# Patient Record
Sex: Male | Born: 1965
Health system: Southern US, Community
[De-identification: ages and names within clinical notes are randomized; demographics above are authoritative.]

## PROBLEM LIST (undated history)

## (undated) DIAGNOSIS — I2510 Atherosclerotic heart disease of native coronary artery without angina pectoris: Secondary | ICD-10-CM

## (undated) DIAGNOSIS — E119 Type 2 diabetes mellitus without complications: Secondary | ICD-10-CM

## (undated) DIAGNOSIS — E785 Hyperlipidemia, unspecified: Secondary | ICD-10-CM

---

## 2016-07-06 DIAGNOSIS — K863 Pseudocyst of pancreas: Secondary | ICD-10-CM | POA: Diagnosis not present

## 2016-07-06 DIAGNOSIS — K859 Acute pancreatitis without necrosis or infection, unspecified: Secondary | ICD-10-CM | POA: Diagnosis not present

## 2016-07-06 DIAGNOSIS — K861 Other chronic pancreatitis: Secondary | ICD-10-CM | POA: Diagnosis not present

## 2016-07-06 DIAGNOSIS — R7989 Other specified abnormal findings of blood chemistry: Secondary | ICD-10-CM | POA: Diagnosis not present

## 2016-07-07 DIAGNOSIS — K863 Pseudocyst of pancreas: Secondary | ICD-10-CM | POA: Diagnosis not present

## 2016-07-07 DIAGNOSIS — Z9689 Presence of other specified functional implants: Secondary | ICD-10-CM | POA: Diagnosis not present

## 2016-07-07 DIAGNOSIS — K861 Other chronic pancreatitis: Secondary | ICD-10-CM | POA: Diagnosis not present

## 2016-07-07 DIAGNOSIS — K862 Cyst of pancreas: Secondary | ICD-10-CM | POA: Diagnosis not present

## 2016-07-07 DIAGNOSIS — R1084 Generalized abdominal pain: Secondary | ICD-10-CM | POA: Diagnosis not present

## 2016-07-07 DIAGNOSIS — R109 Unspecified abdominal pain: Secondary | ICD-10-CM | POA: Diagnosis not present

## 2016-07-07 DIAGNOSIS — F17200 Nicotine dependence, unspecified, uncomplicated: Secondary | ICD-10-CM | POA: Diagnosis not present

## 2016-07-08 DIAGNOSIS — Z9689 Presence of other specified functional implants: Secondary | ICD-10-CM | POA: Diagnosis not present

## 2016-07-08 DIAGNOSIS — R109 Unspecified abdominal pain: Secondary | ICD-10-CM | POA: Diagnosis not present

## 2016-07-08 DIAGNOSIS — K861 Other chronic pancreatitis: Secondary | ICD-10-CM | POA: Diagnosis not present

## 2016-07-16 DIAGNOSIS — Z0000 Encounter for general adult medical examination without abnormal findings: Secondary | ICD-10-CM | POA: Diagnosis not present

## 2016-07-16 DIAGNOSIS — E1165 Type 2 diabetes mellitus with hyperglycemia: Secondary | ICD-10-CM | POA: Diagnosis not present

## 2016-07-16 DIAGNOSIS — Z6827 Body mass index (BMI) 27.0-27.9, adult: Secondary | ICD-10-CM | POA: Diagnosis not present

## 2016-08-08 DIAGNOSIS — K859 Acute pancreatitis without necrosis or infection, unspecified: Secondary | ICD-10-CM | POA: Diagnosis not present

## 2016-08-08 DIAGNOSIS — K863 Pseudocyst of pancreas: Secondary | ICD-10-CM | POA: Diagnosis not present

## 2016-08-08 DIAGNOSIS — K219 Gastro-esophageal reflux disease without esophagitis: Secondary | ICD-10-CM | POA: Diagnosis not present

## 2016-08-08 DIAGNOSIS — K860 Alcohol-induced chronic pancreatitis: Secondary | ICD-10-CM | POA: Diagnosis not present

## 2016-08-08 DIAGNOSIS — Z79899 Other long term (current) drug therapy: Secondary | ICD-10-CM | POA: Diagnosis not present

## 2016-08-08 DIAGNOSIS — G8929 Other chronic pain: Secondary | ICD-10-CM | POA: Diagnosis not present

## 2016-08-08 DIAGNOSIS — F17210 Nicotine dependence, cigarettes, uncomplicated: Secondary | ICD-10-CM | POA: Diagnosis not present

## 2016-08-08 DIAGNOSIS — T182XXA Foreign body in stomach, initial encounter: Secondary | ICD-10-CM | POA: Diagnosis not present

## 2016-08-08 DIAGNOSIS — Z79891 Long term (current) use of opiate analgesic: Secondary | ICD-10-CM | POA: Diagnosis not present

## 2016-08-08 DIAGNOSIS — K862 Cyst of pancreas: Secondary | ICD-10-CM | POA: Diagnosis not present

## 2016-10-03 DIAGNOSIS — E119 Type 2 diabetes mellitus without complications: Secondary | ICD-10-CM | POA: Diagnosis not present

## 2016-12-17 DIAGNOSIS — Z6829 Body mass index (BMI) 29.0-29.9, adult: Secondary | ICD-10-CM | POA: Diagnosis not present

## 2016-12-17 DIAGNOSIS — N529 Male erectile dysfunction, unspecified: Secondary | ICD-10-CM | POA: Diagnosis not present

## 2016-12-17 DIAGNOSIS — Z1389 Encounter for screening for other disorder: Secondary | ICD-10-CM | POA: Diagnosis not present

## 2016-12-17 DIAGNOSIS — E119 Type 2 diabetes mellitus without complications: Secondary | ICD-10-CM | POA: Diagnosis not present

## 2017-06-10 ENCOUNTER — Encounter (HOSPITAL_COMMUNITY): Payer: Self-pay | Admitting: Emergency Medicine

## 2017-06-10 ENCOUNTER — Inpatient Hospital Stay (HOSPITAL_COMMUNITY)
Admission: EM | Admit: 2017-06-10 | Discharge: 2017-06-13 | DRG: 246 | Disposition: A | Discharge status: Home / Self Care | Payer: BLUE CROSS/BLUE SHIELD | Attending: Cardiology | Admitting: Cardiology

## 2017-06-10 DIAGNOSIS — R079 Chest pain, unspecified: Secondary | ICD-10-CM | POA: Diagnosis not present

## 2017-06-10 DIAGNOSIS — E119 Type 2 diabetes mellitus without complications: Secondary | ICD-10-CM | POA: Diagnosis present

## 2017-06-10 DIAGNOSIS — R918 Other nonspecific abnormal finding of lung field: Secondary | ICD-10-CM | POA: Diagnosis not present

## 2017-06-10 DIAGNOSIS — I214 Non-ST elevation (NSTEMI) myocardial infarction: Secondary | ICD-10-CM | POA: Diagnosis not present

## 2017-06-10 DIAGNOSIS — E785 Hyperlipidemia, unspecified: Secondary | ICD-10-CM | POA: Diagnosis not present

## 2017-06-10 DIAGNOSIS — Z955 Presence of coronary angioplasty implant and graft: Secondary | ICD-10-CM

## 2017-06-10 DIAGNOSIS — I5021 Acute systolic (congestive) heart failure: Secondary | ICD-10-CM | POA: Diagnosis present

## 2017-06-10 DIAGNOSIS — I2510 Atherosclerotic heart disease of native coronary artery without angina pectoris: Secondary | ICD-10-CM | POA: Diagnosis not present

## 2017-06-10 DIAGNOSIS — E118 Type 2 diabetes mellitus with unspecified complications: Secondary | ICD-10-CM

## 2017-06-10 DIAGNOSIS — I10 Essential (primary) hypertension: Secondary | ICD-10-CM

## 2017-06-10 DIAGNOSIS — I110 Hypertensive heart disease with heart failure: Secondary | ICD-10-CM | POA: Diagnosis present

## 2017-06-10 DIAGNOSIS — Z79899 Other long term (current) drug therapy: Secondary | ICD-10-CM

## 2017-06-10 DIAGNOSIS — R0789 Other chest pain: Secondary | ICD-10-CM | POA: Diagnosis not present

## 2017-06-10 DIAGNOSIS — I255 Ischemic cardiomyopathy: Secondary | ICD-10-CM

## 2017-06-10 HISTORY — DX: Hyperlipidemia, unspecified: E78.5

## 2017-06-10 HISTORY — DX: Type 2 diabetes mellitus without complications: E11.9

## 2017-06-10 HISTORY — DX: Atherosclerotic heart disease of native coronary artery without angina pectoris: I25.10

## 2017-06-10 LAB — BASIC METABOLIC PANEL
Anion gap: 9 (ref 5–15)
BUN: 23 mg/dL — ABNORMAL HIGH (ref 6–20)
CALCIUM: 9.3 mg/dL (ref 8.9–10.3)
CO2: 25 mmol/L (ref 22–32)
Chloride: 100 mmol/L — ABNORMAL LOW (ref 101–111)
Creatinine, Ser: 1.04 mg/dL (ref 0.61–1.24)
GLUCOSE: 183 mg/dL — AB (ref 65–99)
POTASSIUM: 3.9 mmol/L (ref 3.5–5.1)
SODIUM: 134 mmol/L — AB (ref 135–145)

## 2017-06-10 LAB — CBC
HCT: 42.6 % (ref 39.0–52.0)
HEMOGLOBIN: 14.5 g/dL (ref 13.0–17.0)
MCH: 29.2 pg (ref 26.0–34.0)
MCHC: 34 g/dL (ref 30.0–36.0)
MCV: 85.7 fL (ref 78.0–100.0)
Platelets: 203 10*3/uL (ref 150–400)
RBC: 4.97 MIL/uL (ref 4.22–5.81)
RDW: 12.6 % (ref 11.5–15.5)
WBC: 5.7 10*3/uL (ref 4.0–10.5)

## 2017-06-10 LAB — I-STAT TROPONIN, ED: TROPONIN I, POC: 2.44 ng/mL — AB (ref 0.00–0.08)

## 2017-06-10 LAB — D-DIMER, QUANTITATIVE: D-Dimer, Quant: <0.27 ug/mL-FEU (ref 0.00–0.50)

## 2017-06-10 MED ORDER — HEPARIN (PORCINE) IN NACL 100-0.45 UNIT/ML-% IJ SOLN
1200.0000 [IU]/h | INTRAMUSCULAR | Status: DC
  Administered 2017-06-11: 1000 [IU]/h via INTRAVENOUS
  Filled 2017-06-10: qty 250

## 2017-06-10 MED ORDER — HEPARIN BOLUS VIA INFUSION
4000.0000 [IU] | Freq: Once | INTRAVENOUS | Status: AC
  Administered 2017-06-11: 4000 [IU] via INTRAVENOUS
  Filled 2017-06-10: qty 4000

## 2017-06-10 MED ORDER — ASPIRIN 81 MG PO CHEW
243.0000 mg | CHEWABLE_TABLET | Freq: Once | ORAL | Status: AC
  Administered 2017-06-10: 243 mg via ORAL
  Filled 2017-06-10: qty 3

## 2017-06-10 NOTE — ED Notes (Signed)
Pt sts he has not had any Chest pain today. Pt states the pain gets worse when he eats so he thinks its GERD. States he started taking OTC medication for GERD and it seems to help.

## 2017-06-10 NOTE — Progress Notes (Signed)
ANTICOAGULATION CONSULT NOTE - Initial Consult  Pharmacy Consult for heparin  Indication: chest pain/ACS  Patient Measurements: Height: 5\' 6"  (167.6 cm) Weight: 179 lb (81.2 kg) IBW/kg (Calculated) : 63.8 Heparin Dosing Weight: 80 kg   Vital Signs: Temp: 98.6 F (37 C) (07/03 2143) Temp Source: Oral (07/03 2143) BP: 124/90 (07/03 2300) Pulse Rate: 77 (07/03 2300)  Labs:  Recent Labs  06/10/17 2145  HGB 14.5  HCT 42.6  PLT 203  CREATININE 1.04    CrCl cannot be calculated (Unknown ideal weight.).  Assessment: 51 yo male admitted with chest pain. Pharmacy to dose heparin. No oral anticoagulation PTA. CBC stable with no overt s/s bleeding noted.   Goal of Therapy:  Heparin level 0.3-0.7 units/ml Monitor platelets by anticoagulation protocol: Yes   Plan:  Heparin 4000 units IV x1, then start infusion at 1000 units/hr  Heparin level in 6 hours  Heparin level and CBC daily  Monitor for s/s bleeding  York CeriseKatherine Cook, PharmD Pharmacy Resident  Pager [phone] 06/10/17 11:21 PM

## 2017-06-10 NOTE — ED Notes (Signed)
ED Provider at bedside. 

## 2017-06-10 NOTE — ED Provider Notes (Signed)
[facility]-EMERGENCY DEPT Provider Note   CSN: [id number] Arrival date & time: 06/10/17  2127     History   Chief Complaint Chief Complaint  Patient presents with  . Chest Pain    HPI Brett Collier is a 51 y.o. male.  51 year old male with past medical history of type 2 diabetes mellitus who presents with chest pain. The patient reports 2 weeks of intermittent chest pain that lasts a few hours at a time, occurs at rest, and typically occurs in the evening after he lays down. He describes the pain as a central, nonradiating chest pressure with associated nausea and diaphoresis. No significant shortness of breath during the episodes but he does report some shortness of breath 3 days ago while he was walking around. He denies any chest pain or shortness of breath today. No leg swelling or pain. No fevers, vomiting, diarrhea, or recent illness. He has taken Zantac which has seemed to help his symptoms recently. He has not had pain today but decided to get checked out and went to an urgent care where he was sent here due to concerning EKG. He received 81 mg aspirin prior to arrival. His last episode of chest pain was last night from 9 to 11 PM. He did have a long car ride to [place] and back a few days ago. He denies any bleeding problems or black stools.   He states mom and grandparents had heart problems.   The history is provided by the patient.  Chest Pain      History reviewed. No pertinent past medical history.  There are no active problems to display for this patient.   History reviewed. No pertinent surgical history.     Home Medications    Prior to Admission medications   Not on File    Family History No family history on file.  Social History Social History  Substance Use Topics  . Smoking status: Never Smoker  . Smokeless tobacco: Never Used  . Alcohol use No     Allergies   Patient has no allergy information on record.   Review of Systems Review of  Systems  Cardiovascular: Positive for chest pain.   All other systems reviewed and are negative except that which was mentioned in HPI   Physical Exam Updated Vital Signs BP (!) 135/99   Pulse 78   Temp 98.6 F (37 C) (Oral)   Resp 14   SpO2 99%   Physical Exam  Constitutional: He is oriented to person, place, and time. He appears well-developed and well-nourished. No distress.  HENT:  Head: Normocephalic and atraumatic.  Moist mucous membranes  Eyes: Conjunctivae are normal. Pupils are equal, round, and reactive to light.  Neck: Neck supple.  Cardiovascular: Normal rate, regular rhythm and normal heart sounds.   No murmur heard. Pulmonary/Chest: Effort normal and breath sounds normal.  Abdominal: Soft. Bowel sounds are normal. He exhibits no distension. There is no tenderness.  Musculoskeletal: He exhibits no edema or tenderness.  Neurological: He is alert and oriented to person, place, and time.  Fluent speech  Skin: Skin is warm and dry.  Psychiatric: He has a normal mood and affect. Judgment normal.  Nursing note and vitals reviewed.    ED Treatments / Results  Labs (all labs ordered are listed, but only abnormal results are displayed) Labs Reviewed  BASIC METABOLIC PANEL - Abnormal; Notable for the following:       Result Value   Sodium 134 (*)  Chloride 100 (*)    Glucose, Bld 183 (*)    BUN 23 (*)    All other components within normal limits  I-STAT TROPOININ, ED - Abnormal; Notable for the following:    Troponin i, poc 2.44 (*)    All other components within normal limits  CBC  D-DIMER, QUANTITATIVE (NOT AT [facility])    EKG  EKG Interpretation  Date/Time:  Tuesday June 10 2017 21:43:52 EDT Ventricular Rate:  84 PR Interval:  138 QRS Duration: 84 QT Interval:  344 QTC Calculation: 406 R Axis:   76 Text Interpretation:  Normal sinus rhythm with sinus arrhythmia Septal infarct , age undetermined Abnormal ECG T wave flattening inferiorly, no previous  tracing available Confirmed by Frederick PeersLittle, Darionna Banke [phone]([id number]) on 06/10/2017 10:47:37 PM       Radiology No results found.  Procedures .Critical Care Performed by: Laurence SpatesLITTLE, Hershy Flenner MORGAN Authorized by: Laurence SpatesLITTLE, Shaivi Rothschild MORGAN   Critical care provider statement:    Critical care time (minutes):  30   Critical care time was exclusive of:  Separately billable procedures and treating other patients   Critical care was necessary to treat or prevent imminent or life-threatening deterioration of the following conditions:  Cardiac failure   Critical care was time spent personally by me on the following activities:  Development of treatment plan with patient or surrogate, discussions with consultants, examination of patient, obtaining history from patient or surrogate, ordering and performing treatments and interventions, ordering and review of laboratory studies, ordering and review of radiographic studies and re-evaluation of patient's condition   (including critical care time)  Medications Ordered in ED Medications  heparin ADULT infusion 100 units/mL (25000 units/24150mL sodium chloride 0.45%) (1,000 Units/hr Intravenous New Bag/Given 06/11/17 0036)  aspirin chewable tablet 243 mg (243 mg Oral Given 06/10/17 2318)  heparin bolus via infusion 4,000 Units (4,000 Units Intravenous Bolus from Bag 06/11/17 0039)     Initial Impression / Assessment and Plan / ED Course  I have reviewed the triage vital signs and the nursing notes.  Pertinent labs & imaging results that were available during my care of the patient were reviewed by me and considered in my medical decision making (see chart for details).     Pt w/ 2 weeks of intermittent chest pressure, Typically occur in the evenings and seemed to improve his Zantac sometimes. Sent here after concerning EKG at urgent care today. He was comfortable on exam and denied any symptoms of chest pain or shortness of breath today. His EKG shows no significant ST elevation  but did have some T-wave flattening inferiorly. Gave 3 more aspirin and obtained labs. Trop elevated at 2.4. Initiated a heparin drip. At a d-dimer because of his recent long travel. D-dimer negative. Discussed w/ cardiology, Dr. Virgina OrganQureshi, who will admit pt for further care. Pt chest pain free during ED course.  Final Clinical Impressions(s) / ED Diagnoses   Final diagnoses:  NSTEMI (non-ST elevated myocardial infarction) [facility](HCC)    New Prescriptions New Prescriptions   No medications on file     Oscar Hank, Ambrose Finlandachel Morgan, MD 06/11/17 [phone]

## 2017-06-10 NOTE — ED Notes (Signed)
Patient transported to X-ray 

## 2017-06-10 NOTE — ED Triage Notes (Signed)
Pt reports centralized chest pain for over two weeks. Pt believed it is indigestion however when he went to urgent care they sent him due to a "concerning EKG."  Denies weakness, sob, n/v/d.

## 2017-06-11 ENCOUNTER — Encounter (HOSPITAL_COMMUNITY): Payer: Self-pay | Admitting: Internal Medicine

## 2017-06-11 ENCOUNTER — Inpatient Hospital Stay (HOSPITAL_COMMUNITY): Payer: BLUE CROSS/BLUE SHIELD

## 2017-06-11 DIAGNOSIS — I110 Hypertensive heart disease with heart failure: Secondary | ICD-10-CM | POA: Diagnosis present

## 2017-06-11 DIAGNOSIS — E119 Type 2 diabetes mellitus without complications: Secondary | ICD-10-CM

## 2017-06-11 DIAGNOSIS — E118 Type 2 diabetes mellitus with unspecified complications: Secondary | ICD-10-CM

## 2017-06-11 DIAGNOSIS — I214 Non-ST elevation (NSTEMI) myocardial infarction: Secondary | ICD-10-CM | POA: Diagnosis not present

## 2017-06-11 DIAGNOSIS — Z79899 Other long term (current) drug therapy: Secondary | ICD-10-CM | POA: Diagnosis not present

## 2017-06-11 DIAGNOSIS — I5021 Acute systolic (congestive) heart failure: Secondary | ICD-10-CM | POA: Diagnosis not present

## 2017-06-11 DIAGNOSIS — I2510 Atherosclerotic heart disease of native coronary artery without angina pectoris: Secondary | ICD-10-CM | POA: Diagnosis not present

## 2017-06-11 DIAGNOSIS — R079 Chest pain, unspecified: Secondary | ICD-10-CM | POA: Diagnosis not present

## 2017-06-11 DIAGNOSIS — E785 Hyperlipidemia, unspecified: Secondary | ICD-10-CM | POA: Diagnosis not present

## 2017-06-11 HISTORY — PX: TRANSTHORACIC ECHOCARDIOGRAM: SHX275

## 2017-06-11 LAB — CBC
HEMATOCRIT: 42.2 % (ref 39.0–52.0)
Hemoglobin: 14.4 g/dL (ref 13.0–17.0)
MCH: 29.1 pg (ref 26.0–34.0)
MCHC: 34.1 g/dL (ref 30.0–36.0)
MCV: 85.4 fL (ref 78.0–100.0)
PLATELETS: 187 10*3/uL (ref 150–400)
RBC: 4.94 MIL/uL (ref 4.22–5.81)
RDW: 12.5 % (ref 11.5–15.5)
WBC: 5.3 10*3/uL (ref 4.0–10.5)

## 2017-06-11 LAB — GLUCOSE, CAPILLARY
GLUCOSE-CAPILLARY: 169 mg/dL — AB (ref 65–99)
Glucose-Capillary: 215 mg/dL — ABNORMAL HIGH (ref 65–99)
Glucose-Capillary: 227 mg/dL — ABNORMAL HIGH (ref 65–99)
Glucose-Capillary: 154 mg/dL — ABNORMAL HIGH (ref 65–99)
Glucose-Capillary: 213 mg/dL — ABNORMAL HIGH (ref 65–99)

## 2017-06-11 LAB — COMPREHENSIVE METABOLIC PANEL
ALT: 23 U/L (ref 17–63)
AST: 27 U/L (ref 15–41)
Albumin: 3.7 g/dL (ref 3.5–5.0)
Alkaline Phosphatase: 43 U/L (ref 38–126)
Anion gap: 8 (ref 5–15)
BUN: 19 mg/dL (ref 6–20)
CHLORIDE: 102 mmol/L (ref 101–111)
CO2: 28 mmol/L (ref 22–32)
CREATININE: 1.08 mg/dL (ref 0.61–1.24)
Calcium: 9.2 mg/dL (ref 8.9–10.3)
Glucose, Bld: 204 mg/dL — ABNORMAL HIGH (ref 65–99)
POTASSIUM: 3.6 mmol/L (ref 3.5–5.1)
Sodium: 138 mmol/L (ref 135–145)
Total Bilirubin: 1 mg/dL (ref 0.3–1.2)
Total Protein: 6.7 g/dL (ref 6.5–8.1)

## 2017-06-11 LAB — TSH: TSH: 0.949 u[IU]/mL (ref 0.350–4.500)

## 2017-06-11 LAB — LIPID PANEL
CHOLESTEROL: 164 mg/dL (ref 0–200)
HDL: 29 mg/dL — ABNORMAL LOW (ref >40)
LDL Cholesterol: 109 mg/dL — ABNORMAL HIGH (ref 0–99)
Total CHOL/HDL Ratio: 5.7 RATIO
Triglycerides: 129 mg/dL (ref <150)
VLDL: 26 mg/dL (ref 0–40)

## 2017-06-11 LAB — TROPONIN I
TROPONIN I: 2.57 ng/mL — AB (ref <0.03)
TROPONIN I: 2.32 ng/mL — AB (ref <0.03)
TROPONIN I: 2.14 ng/mL — AB (ref <0.03)

## 2017-06-11 LAB — ECHOCARDIOGRAM COMPLETE
HEIGHTINCHES: 66 in
Weight: 2843.2 oz

## 2017-06-11 LAB — HEPARIN LEVEL (UNFRACTIONATED)
HEPARIN UNFRACTIONATED: 0.24 [IU]/mL — AB (ref 0.30–0.70)
Heparin Unfractionated: 0.29 IU/mL — ABNORMAL LOW (ref 0.30–0.70)

## 2017-06-11 LAB — BRAIN NATRIURETIC PEPTIDE: B NATRIURETIC PEPTIDE 5: 212.5 pg/mL — AB (ref 0.0–100.0)

## 2017-06-11 LAB — HIV ANTIBODY (ROUTINE TESTING W REFLEX): HIV Screen 4th Generation wRfx: NONREACTIVE

## 2017-06-11 LAB — PROTIME-INR
INR: 1.07
PROTHROMBIN TIME: 14 s (ref 11.4–15.2)

## 2017-06-11 MED ORDER — HEPARIN (PORCINE) IN NACL 100-0.45 UNIT/ML-% IJ SOLN
1350.0000 [IU]/h | INTRAMUSCULAR | Status: DC
  Administered 2017-06-11: 1350 [IU]/h via INTRAVENOUS
  Filled 2017-06-11: qty 250

## 2017-06-11 MED ORDER — INSULIN ASPART 100 UNIT/ML ~~LOC~~ SOLN
0.0000 [IU] | Freq: Every day | SUBCUTANEOUS | Status: DC
  Administered 2017-06-11: 2 [IU] via SUBCUTANEOUS
  Administered 2017-06-12: 4 [IU] via SUBCUTANEOUS

## 2017-06-11 MED ORDER — SODIUM CHLORIDE 0.9% FLUSH: 3.0000 mL | INTRAVENOUS | Status: DC | PRN

## 2017-06-11 MED ORDER — SODIUM CHLORIDE 0.9 % IV SOLN: 250.0000 mL | INTRAVENOUS | Status: DC | PRN

## 2017-06-11 MED ORDER — SODIUM CHLORIDE 0.9% FLUSH
3.0000 mL | Freq: Two times a day (BID) | INTRAVENOUS | Status: DC
  Administered 2017-06-11: 3 mL via INTRAVENOUS

## 2017-06-11 MED ORDER — SODIUM CHLORIDE 0.9 % WEIGHT BASED INFUSION
3.0000 mL/kg/h | INTRAVENOUS | Status: DC
  Administered 2017-06-12: 3 mL/kg/h via INTRAVENOUS

## 2017-06-11 MED ORDER — ASPIRIN EC 81 MG PO TBEC
81.0000 mg | DELAYED_RELEASE_TABLET | Freq: Every day | ORAL | Status: DC
Start: 2017-06-11 — End: 2017-06-13
  Administered 2017-06-11: 81 mg via ORAL
  Filled 2017-06-11: qty 1

## 2017-06-11 MED ORDER — INSULIN ASPART 100 UNIT/ML ~~LOC~~ SOLN
0.0000 [IU] | Freq: Three times a day (TID) | SUBCUTANEOUS | Status: DC
  Administered 2017-06-11: 3 [IU] via SUBCUTANEOUS
  Administered 2017-06-11: 2 [IU] via SUBCUTANEOUS
  Administered 2017-06-11: 3 [IU] via SUBCUTANEOUS
  Administered 2017-06-12: 12:00:00 2 [IU] via SUBCUTANEOUS
  Administered 2017-06-12: 3 [IU] via SUBCUTANEOUS
  Administered 2017-06-12: 17:00:00 2 [IU] via SUBCUTANEOUS
  Administered 2017-06-13: 07:00:00 3 [IU] via SUBCUTANEOUS

## 2017-06-11 MED ORDER — ATORVASTATIN CALCIUM 80 MG PO TABS
80.0000 mg | ORAL_TABLET | Freq: Every day | ORAL | Status: DC
  Administered 2017-06-11 – 2017-06-12 (×2): 80 mg via ORAL
  Filled 2017-06-11 (×2): qty 1

## 2017-06-11 MED ORDER — ASPIRIN 81 MG PO CHEW
81.0000 mg | CHEWABLE_TABLET | ORAL | Status: AC
  Administered 2017-06-12: 81 mg via ORAL
  Filled 2017-06-11: qty 1

## 2017-06-11 MED ORDER — SODIUM CHLORIDE 0.9 % WEIGHT BASED INFUSION: 1.0000 mL/kg/h | INTRAVENOUS | Status: DC

## 2017-06-11 MED ORDER — SODIUM CHLORIDE 0.9% FLUSH: 3.0000 mL | Freq: Two times a day (BID) | INTRAVENOUS | Status: DC

## 2017-06-11 NOTE — Progress Notes (Signed)
Subjective:  Admitted with some atypical chest discomfort last night but has abnormal troponins and an EKG suggestive of an old anteroseptal infarction.  Currently pain-free on heparin.  Not happy about having to stay in the hospital.  Objective:  Vital Signs in the last 24 hours: BP (!) 157/89 (BP Location: Right Arm)   Pulse 79   Temp 98.3 F (36.8 C) (Oral)   Resp 18   Ht 5' 6" (1.676 m)   Wt 80.6 kg (177 lb 11.2 oz)   SpO2 99%   BMI 28.68 kg/m   Physical Exam: Middle-aged black male no acute distress Lungs:  Clear Cardiac:  Regular rhythm, normal S1 and S2, no S3 Extremities:  No edema present  Intake/Output from previous day: 07/03 0701 - 07/04 0700 In: 294 [P.O.:240; I.V.:54] Out: 275 [Urine:275]  Weight Filed Weights   06/10/17 2328 06/11/17 0308  Weight: 81.2 kg (179 lb) 80.6 kg (177 lb 11.2 oz)    Lab Results: Basic Metabolic Panel:  Recent Labs  06/10/17 2145 06/11/17 0428  NA 134* 138  K 3.9 3.6  CL 100* 102  CO2 25 28  GLUCOSE 183* 204*  BUN 23* 19  CREATININE 1.04 1.08   CBC:  Recent Labs  06/10/17 2145 06/11/17 0428  WBC 5.7 5.3  HGB 14.5 14.4  HCT 42.6 42.2  MCV 85.7 85.4  PLT 203 187   Cardiac Enzymes: Troponin (Point of Care Test)  Recent Labs  06/10/17 2202  TROPIPOC 2.44*   Cardiac Panel (last 3 results)  Recent Labs  06/11/17 0428 06/11/17 0801  TROPONINI 2.57* 2.32*    Telemetry: Personally reviewed.  Normal sinus rhythm  Assessment/Plan:  1.  Non-STEMI with elevated troponins 2.  Diabetes mellitus 3.  Hypertension  Recommendations:  Abnormal troponins and diabetes with chest pain will need cardiac catheterization.Cardiac catheterization was discussed with the patient fully including risks of myocardial infarction, death, stroke, bleeding, arrhythmia, dye allergy, renal insufficiency or bleeding.  The patient understands and is willing to proceed.  Possibility of intervention at the same time also discussed  with patient and he understands and is willing to proceed.  Discussed catheterization through the radial approach.  To be done by [facility] heart [facility].     W. Spencer Tilley, Jr.  MD FACC Cardiology  06/11/2017, 9:54 AM   

## 2017-06-11 NOTE — Progress Notes (Signed)
ANTICOAGULATION CONSULT NOTE - FOLLOW UP    HL = 0.29 (goal 0.3 - 0.7 units/mL) Heparin dosing weight = 80 kg   Assessment: [name] continues on IV heparin for ACS.  Heparin level is slightly below goal post rate increase this AM.  No bleeding nor complication with heparin infusion per RN.   Plan: - Increase heparin gtt to 1350 units/hr - F/U AM labs    Brett Collier, PharmD, BCPS 06/11/2017, 4:57 PM

## 2017-06-11 NOTE — Progress Notes (Signed)
ANTICOAGULATION CONSULT NOTE - Initial Consult  Pharmacy Consult for heparin  Indication: chest pain/ACS  Patient Measurements: Height: 5\' 6"  (167.6 cm) Weight: 177 lb 11.2 oz (80.6 kg) IBW/kg (Calculated) : 63.8 Heparin Dosing Weight: 80 kg   Vital Signs: Temp: 98.3 F (36.8 C) (07/04 0308) Temp Source: Oral (07/04 0308) BP: 157/89 (07/04 0308) Pulse Rate: 79 (07/04 0308)  Labs:  Recent Labs  06/10/17 2145 06/11/17 0428 06/11/17 0801 06/11/17 0811  HGB 14.5 14.4  --   --   HCT 42.6 42.2  --   --   PLT 203 187  --   --   LABPROT  --  14.0  --   --   INR  --  1.07  --   --   HEPARINUNFRC  --   --   --  0.24*  CREATININE 1.04 1.08  --   --   TROPONINI  --  2.57* 2.32*  --     Estimated Creatinine Clearance: 79.8 mL/min (by C-G formula based on SCr of 1.08 mg/dL).  Assessment: [name] admitted with chest pain and found to have elevated trops and EKG changes. Pharmacy to dose heparin, no oral anticoagulation PTA, CBC wnl. Initial heparin level subtherapeutic at 0.24.  Goal of Therapy:  Heparin level 0.3-0.7 units/ml Monitor platelets by anticoagulation protocol: Yes   Plan:  -Increase heparin to 1200 units/hr -Heparin level in 6 hours  -Monitor heparin level, CBC, S/Sx bleeding daily -F/U plans for cath  Fredonia HighlandMichael Bitonti, PharmD PGY-2 Cardiology Pharmacy Resident Pager: [phone] 06/11/2017

## 2017-06-11 NOTE — Progress Notes (Signed)
  Echocardiogram 2D Echocardiogram has been performed.  [name] [name] 06/11/2017, 3:45 PM

## 2017-06-11 NOTE — H&P (Signed)
CARDIOLOGY INPATIENT HISTORY AND PHYSICAL EXAMINATION NOTE  Patient ID: Brett Collier MRN: [id number], DOB/AGE: 52-Nov-1967   Admit date: 06/10/2017   Primary Physician: No primary care provider on file. Primary Cardiologist: new  Reason for admission: chest pain  HPI: This is a 51 y.o.AA male with no known history of CAD but with NIDDM2 presented with intermittent chest pain for last week. He went to urgent care today since he has been having indigestion like symptoms for the last week for which he has been taking zantac which was partially helping his symptoms. At urgent care he was found to have abnormal ECG with non specific ST depression in the inferior leads and Q waves in the anteroseptal leads. Patient has been having chest pains for the last 2 weeks. Eating and laying down makes the chest pain come. Last chest pain was around 9 PM last night. He was sent to [facility] ED where he underwent d-dimer test, trop and ECG. His istat trop was 2.44. He is being admitted for NSTEMI.   Problem List: Past Medical History:  Diagnosis Date  . Diabetes (HCC)     History reviewed. No pertinent surgical history.   Allergies: No Known Allergies   Home Medications Current Facility-Administered Medications  Medication Dose Route Frequency Provider Last Rate Last Dose  . heparin ADULT infusion 100 units/mL (25000 units/270mL sodium chloride 0.45%)  1,000 Units/hr Intravenous Continuous [name], RPH      . heparin bolus via infusion 4,000 Units  4,000 Units Intravenous Once [name], [facility]       Current Outpatient Prescriptions  Medication Sig Dispense Refill  . metFORMIN (GLUCOPHAGE-XR) 500 MG 24 hr tablet Take 1,000 mg by mouth 2 (two) times daily.    . pioglitazone (ACTOS) 30 MG tablet Take 30 mg by mouth daily.       Family History  Problem Relation Age of Onset  . Heart Problems Mother   . Heart Problems Maternal Grandfather      Social History   Social History  .  Marital status: Married    Spouse name: N/A  . Number of children: N/A  . Years of education: N/A   Occupational History  . Not on file.   Social History Main Topics  . Smoking status: Never Smoker  . Smokeless tobacco: Never Used  . Alcohol use No  . Drug use: No  . Sexual activity: Not on file   Other Topics Concern  . Not on file   Social History Narrative  . No narrative on file     Review of Systems: General: negative for chills, fever, night sweats or weight changes.  Cardiovascular: chest pain,, no chf symptoms Dermatological: negative for rash Respiratory: negative for cough or wheezing Urologic: negative for hematuria Abdominal: indigestion,no nausea Neurologic: negative for visual changes, syncope, or dizziness Endocrine: no diabetes, no hypothyroidism Immunological: no lymph adenopathy Psych: non homicidal/suicidal  Physical Exam: Vitals: BP (!) 125/92   Pulse 78   Temp 98.6 F (37 C) (Oral)   Resp 18   Ht 5\' 6"  (1.676 m)   Wt 81.2 kg (179 lb)   SpO2 99%   BMI 28.89 kg/m  General: not in acute distress Neck: JVP flat, neck supple Heart: regular rate and rhythm, S1, S2, no murmurs  Lungs: CTAB  GI: non tender, non distended, bowel sounds present Extremities: no edema Neuro: AAO x 3  Psych: normal affect, no anxiety   Labs:   Results for orders placed or  performed during the hospital encounter of 06/10/17 (from the past 24 hour(s))  Basic metabolic panel     Status: Abnormal   Collection Time: 06/10/17  9:45 PM  Result Value Ref Range   Sodium 134 (L) 135 - 145 mmol/L   Potassium 3.9 3.5 - 5.1 mmol/L   Chloride 100 (L) 101 - 111 mmol/L   CO2 25 22 - 32 mmol/L   Glucose, Bld 183 (H) 65 - 99 mg/dL   BUN 23 (H) 6 - 20 mg/dL   Creatinine, Ser 0.861.04 0.61 - 1.24 mg/dL   Calcium 9.3 8.9 - 57.810.3 mg/dL   GFR calc non Af Amer >60 >60 mL/min   GFR calc Af Amer >60 >60 mL/min   Anion gap 9 5 - 15  CBC     Status: None   Collection Time: 06/10/17   9:45 PM  Result Value Ref Range   WBC 5.7 4.0 - 10.5 K/uL   RBC 4.97 4.22 - 5.81 MIL/uL   Hemoglobin 14.5 13.0 - 17.0 g/dL   HCT 46.942.6 62.939.0 - 52.852.0 %   MCV 85.7 78.0 - 100.0 fL   MCH 29.2 26.0 - 34.0 pg   MCHC 34.0 30.0 - 36.0 g/dL   RDW 41.312.6 24.411.5 - 01.015.5 %   Platelets 203 150 - 400 K/uL  I-stat troponin, ED     Status: Abnormal   Collection Time: 06/10/17 10:02 PM  Result Value Ref Range   Troponin i, poc 2.44 (HH) 0.00 - 0.08 ng/mL   Comment NOTIFIED PHYSICIAN    Comment 3          D-dimer, quantitative     Status: None   Collection Time: 06/10/17 11:02 PM  Result Value Ref Range   D-Dimer, Quant <0.27 0.00 - 0.50 ug/mL-FEU     Radiology/Studies: No results found.  EKG: normal sinus rhythm, non specific ST changes in the inferior leads, septal Q waves, possible LVH  Echo: pending  Cardiac cath: none before  Medical decision making:  Discussed care with the patient Discussed care with the physician on the phone Reviewed labs and imaging personally Reviewed prior records  ASSESSMENT AND PLAN:  This is a 51 y.o. AA male without known history of CAD but NIDDM2 presented with 1 wk history of intermittent chest pain and indigestion who was found to have abnormal ECG with inferior ST changes and septal Q waves and troponin of 2.44 and is being managed for NSTEMI.   Active Problems:   NSTEMI (non-ST elevated myocardial infarction) (HCC)   NSTEMI Cycle troponin, serial EKGs prn, lipid panel, TSH, HbA1c, echocardiogram in the AM IV heparin, aspirin, high dose statin (lipitor 80 mg daily), Consider cardiac catheterization   Diabetes mellitus with no complications SSI, finger sticks No oral hypoglycemics HbA1c   Signed, Joellyn RuedQURESHI, Warrene Kapfer T, MD MS 06/11/2017, 12:17 AM

## 2017-06-12 ENCOUNTER — Encounter (HOSPITAL_COMMUNITY)
Admission: EM | Disposition: A | Discharge status: Home / Self Care | Payer: Self-pay | Source: Home / Self Care | Attending: Cardiology

## 2017-06-12 ENCOUNTER — Encounter (HOSPITAL_COMMUNITY): Payer: Self-pay | Admitting: Cardiology

## 2017-06-12 DIAGNOSIS — I2510 Atherosclerotic heart disease of native coronary artery without angina pectoris: Secondary | ICD-10-CM

## 2017-06-12 HISTORY — PX: LEFT HEART CATH AND CORONARY ANGIOGRAPHY: CATH118249

## 2017-06-12 HISTORY — PX: CORONARY STENT INTERVENTION: CATH118234

## 2017-06-12 LAB — CBC
HCT: 43 % (ref 39.0–52.0)
Hemoglobin: 14.8 g/dL (ref 13.0–17.0)
MCH: 29.4 pg (ref 26.0–34.0)
MCHC: 34.4 g/dL (ref 30.0–36.0)
MCV: 85.3 fL (ref 78.0–100.0)
Platelets: 191 10*3/uL (ref 150–400)
RBC: 5.04 MIL/uL (ref 4.22–5.81)
RDW: 12.5 % (ref 11.5–15.5)
WBC: 4.8 10*3/uL (ref 4.0–10.5)

## 2017-06-12 LAB — GLUCOSE, CAPILLARY
GLUCOSE-CAPILLARY: 203 mg/dL — AB (ref 65–99)
GLUCOSE-CAPILLARY: 162 mg/dL — AB (ref 65–99)
Glucose-Capillary: 182 mg/dL — ABNORMAL HIGH (ref 65–99)
Glucose-Capillary: 302 mg/dL — ABNORMAL HIGH (ref 65–99)

## 2017-06-12 LAB — HEMOGLOBIN A1C
HEMOGLOBIN A1C: 7.9 % — AB (ref 4.8–5.6)
Mean Plasma Glucose: 180 mg/dL

## 2017-06-12 LAB — HEPARIN LEVEL (UNFRACTIONATED): HEPARIN UNFRACTIONATED: 0.58 [IU]/mL (ref 0.30–0.70)

## 2017-06-12 SURGERY — LEFT HEART CATH AND CORONARY ANGIOGRAPHY
Anesthesia: LOCAL

## 2017-06-12 MED ORDER — HEPARIN (PORCINE) IN NACL 2-0.9 UNIT/ML-% IJ SOLN
INTRAMUSCULAR | Status: AC | PRN
  Administered 2017-06-12: 1000 mL

## 2017-06-12 MED ORDER — SODIUM CHLORIDE 0.9 % IV SOLN: INTRAVENOUS | Status: AC

## 2017-06-12 MED ORDER — ACTIVE PARTNERSHIP FOR HEALTH OF YOUR HEART BOOK
Freq: Once | Status: AC
  Administered 2017-06-12: 12:00:00
  Filled 2017-06-12: qty 1

## 2017-06-12 MED ORDER — VERAPAMIL HCL 2.5 MG/ML IV SOLN
INTRAVENOUS | Status: DC | PRN
  Administered 2017-06-12: 10 mL via INTRA_ARTERIAL

## 2017-06-12 MED ORDER — IOPAMIDOL (ISOVUE-370) INJECTION 76%
INTRAVENOUS | Status: DC | PRN
  Administered 2017-06-12: 235 mL via INTRA_ARTERIAL

## 2017-06-12 MED ORDER — FENTANYL CITRATE (PF) 100 MCG/2ML IJ SOLN
INTRAMUSCULAR | Status: DC | PRN
  Administered 2017-06-12: 50 ug via INTRAVENOUS

## 2017-06-12 MED ORDER — VERAPAMIL HCL 2.5 MG/ML IV SOLN
INTRAVENOUS | Status: AC
  Filled 2017-06-12: qty 2

## 2017-06-12 MED ORDER — MIDAZOLAM HCL 2 MG/2ML IJ SOLN
INTRAMUSCULAR | Status: DC | PRN
  Administered 2017-06-12: 2 mg via INTRAVENOUS

## 2017-06-12 MED ORDER — SODIUM CHLORIDE 0.9% FLUSH
3.0000 mL | Freq: Two times a day (BID) | INTRAVENOUS | Status: DC
  Administered 2017-06-12: 3 mL via INTRAVENOUS

## 2017-06-12 MED ORDER — TICAGRELOR 90 MG PO TABS
ORAL_TABLET | ORAL | Status: AC
  Filled 2017-06-12: qty 1

## 2017-06-12 MED ORDER — NITROGLYCERIN 1 MG/10 ML FOR IR/CATH LAB
INTRA_ARTERIAL | Status: AC
  Filled 2017-06-12: qty 10

## 2017-06-12 MED ORDER — NITROGLYCERIN 1 MG/10 ML FOR IR/CATH LAB
INTRA_ARTERIAL | Status: DC | PRN
  Administered 2017-06-12: 200 ug

## 2017-06-12 MED ORDER — HYDRALAZINE HCL 20 MG/ML IJ SOLN: 5.0000 mg | INTRAMUSCULAR | Status: AC | PRN

## 2017-06-12 MED ORDER — ACETAMINOPHEN 325 MG PO TABS: 650.0000 mg | ORAL_TABLET | ORAL | Status: DC | PRN

## 2017-06-12 MED ORDER — HEPARIN (PORCINE) IN NACL 2-0.9 UNIT/ML-% IJ SOLN
INTRAMUSCULAR | Status: AC
  Filled 2017-06-12: qty 500

## 2017-06-12 MED ORDER — THE SENSUOUS HEART BOOK
Freq: Once | Status: AC
  Administered 2017-06-12: 12:00:00
  Filled 2017-06-12: qty 1

## 2017-06-12 MED ORDER — LABETALOL HCL 5 MG/ML IV SOLN: 10.0000 mg | INTRAVENOUS | Status: AC | PRN

## 2017-06-12 MED ORDER — LIDOCAINE HCL (PF) 1 % IJ SOLN
INTRAMUSCULAR | Status: DC | PRN
  Administered 2017-06-12: 2 mL

## 2017-06-12 MED ORDER — IOPAMIDOL (ISOVUE-370) INJECTION 76%
INTRAVENOUS | Status: AC
  Filled 2017-06-12: qty 100

## 2017-06-12 MED ORDER — HEPARIN SODIUM (PORCINE) 1000 UNIT/ML IJ SOLN
INTRAMUSCULAR | Status: DC | PRN
  Administered 2017-06-12: 4000 [IU] via INTRAVENOUS
  Administered 2017-06-12: 4500 [IU] via INTRAVENOUS

## 2017-06-12 MED ORDER — SODIUM CHLORIDE 0.9% FLUSH: 3.0000 mL | INTRAVENOUS | Status: DC | PRN

## 2017-06-12 MED ORDER — SODIUM CHLORIDE 0.9 % IV SOLN: 250.0000 mL | INTRAVENOUS | Status: DC | PRN

## 2017-06-12 MED ORDER — HEART ATTACK BOUNCING BOOK
Freq: Once | Status: DC
  Filled 2017-06-12: qty 1

## 2017-06-12 MED ORDER — TICAGRELOR 90 MG PO TABS
90.0000 mg | ORAL_TABLET | Freq: Two times a day (BID) | ORAL | Status: DC
  Administered 2017-06-12: 22:00:00 90 mg via ORAL
  Filled 2017-06-12: qty 1

## 2017-06-12 MED ORDER — ANGIOPLASTY BOOK
Freq: Once | Status: AC
  Administered 2017-06-12: 12:00:00
  Filled 2017-06-12: qty 1

## 2017-06-12 MED ORDER — ONDANSETRON HCL 4 MG/2ML IJ SOLN: 4.0000 mg | Freq: Four times a day (QID) | INTRAMUSCULAR | Status: DC | PRN

## 2017-06-12 MED ORDER — FENTANYL CITRATE (PF) 100 MCG/2ML IJ SOLN
INTRAMUSCULAR | Status: AC
  Filled 2017-06-12: qty 2

## 2017-06-12 MED ORDER — TICAGRELOR 90 MG PO TABS
ORAL_TABLET | ORAL | Status: DC | PRN
  Administered 2017-06-12: 180 mg via ORAL

## 2017-06-12 MED ORDER — MIDAZOLAM HCL 2 MG/2ML IJ SOLN
INTRAMUSCULAR | Status: AC
  Filled 2017-06-12: qty 2

## 2017-06-12 MED ORDER — IOPAMIDOL (ISOVUE-370) INJECTION 76%
INTRAVENOUS | Status: AC
  Filled 2017-06-12: qty 50

## 2017-06-12 SURGICAL SUPPLY — 17 items
BALLN SAPPHIRE 2.0X12 (BALLOONS) ×2
BALLOON SAPPHIRE 2.0X12 (BALLOONS) ×1 IMPLANT
CATH LAUNCHER 6FR EBU3.5 (CATHETERS) ×2 IMPLANT
CATH OPTITORQUE TIG 4.0 5F (CATHETERS) ×2 IMPLANT
CATH VISTA GUIDE 6FR XB3 SH (CATHETERS) ×2 IMPLANT
DEVICE RAD COMP TR BAND LRG (VASCULAR PRODUCTS) ×2 IMPLANT
GLIDESHEATH SLEND A-KIT 6F 22G (SHEATH) ×2 IMPLANT
GUIDEWIRE INQWIRE 1.5J.035X260 (WIRE) ×1 IMPLANT
INQWIRE 1.5J .035X260CM (WIRE) ×2
KIT ENCORE 26 ADVANTAGE (KITS) ×2 IMPLANT
KIT HEART LEFT (KITS) ×2 IMPLANT
PACK CARDIAC CATHETERIZATION (CUSTOM PROCEDURE TRAY) ×2 IMPLANT
STENT SYNERGY DES 2.5X20 (Permanent Stent) ×2 IMPLANT
TRANSDUCER W/STOPCOCK (MISCELLANEOUS) ×2 IMPLANT
TUBING CIL FLEX 10 FLL-RA (TUBING) ×2 IMPLANT
VALVE GUARDIAN II ~~LOC~~ HEMO (MISCELLANEOUS) ×2 IMPLANT
WIRE ASAHI PROWATER 180CM (WIRE) ×2 IMPLANT

## 2017-06-12 NOTE — Progress Notes (Signed)
TR BAND REMOVAL  LOCATION:    right radial  DEFLATED PER PROTOCOL:    Yes.    TIME BAND OFF / DRESSING APPLIED:    14:30   SITE UPON ARRIVAL:    Level 1  SITE AFTER BAND REMOVAL:    Level 0  CIRCULATION SENSATION AND MOVEMENT:    Within Normal Limits   Yes.    COMMENTS:   Post TR band instructions given. Pt tolerated well.

## 2017-06-12 NOTE — Interval H&P Note (Signed)
History and Physical Interval Note:  06/12/2017 9:30 AM  Brett Collier  has presented today for surgery, with the diagnosis of NSTEMI.    The various methods of treatment have been discussed with the patient and family. After consideration of risks, benefits and other options for treatment, the patient has consented to  Procedure(s): Left Heart Cath and Coronary Angiography (N/A) as a surgical intervention .  The patient's history has been reviewed, patient examined, no change in status, stable for surgery.  I have reviewed the patient's chart and labs.  Questions were answered to the patient's satisfaction.   Cath Lab Visit (complete for each Cath Lab visit)  Clinical Evaluation Leading to the Procedure:   ACS: Yes.    Non-ACS:    Anginal Classification: CCS III  Anti-ischemic medical therapy: Minimal Therapy (1 class of medications)  Non-Invasive Test Results: No non-invasive testing performed  Prior CABG: No previous CABG   Brett Collier

## 2017-06-12 NOTE — H&P (View-Only) (Signed)
Subjective:  Admitted with some atypical chest discomfort last night but has abnormal troponins and an EKG suggestive of an old anteroseptal infarction.  Currently pain-free on heparin.  Not happy about having to stay in the hospital.  Objective:  Vital Signs in the last 24 hours: BP (!) 157/89 (BP Location: Right Arm)   Pulse 79   Temp 98.3 F (36.8 C) (Oral)   Resp 18   Ht 5\' 6"  (1.676 m)   Wt 80.6 kg (177 lb 11.2 oz)   SpO2 99%   BMI 28.68 kg/m   Physical Exam: Middle-aged black male no acute distress Lungs:  Clear Cardiac:  Regular rhythm, normal S1 and S2, no S3 Extremities:  No edema present  Intake/Output from previous day: 07/03 0701 - 07/04 0700 In: 294 [P.O.:240; I.V.:54] Out: 275 [Urine:275]  Weight Filed Weights   06/10/17 2328 06/11/17 0308  Weight: 81.2 kg (179 lb) 80.6 kg (177 lb 11.2 oz)    Lab Results: Basic Metabolic Panel:  Recent Labs  82/95/6206/02/23 2145 06/11/17 0428  NA 134* 138  K 3.9 3.6  CL 100* 102  CO2 25 28  GLUCOSE 183* 204*  BUN 23* 19  CREATININE 1.04 1.08   CBC:  Recent Labs  06/10/17 2145 06/11/17 0428  WBC 5.7 5.3  HGB 14.5 14.4  HCT 42.6 42.2  MCV 85.7 85.4  PLT 203 187   Cardiac Enzymes: Troponin (Point of Care Test)  Recent Labs  06/10/17 2202  TROPIPOC 2.44*   Cardiac Panel (last 3 results)  Recent Labs  06/11/17 0428 06/11/17 0801  TROPONINI 2.57* 2.32*    Telemetry: Personally reviewed.  Normal sinus rhythm  Assessment/Plan:  1.  Non-STEMI with elevated troponins 2.  Diabetes mellitus 3.  Hypertension  Recommendations:  Abnormal troponins and diabetes with chest pain will need cardiac catheterization.Cardiac catheterization was discussed with the patient fully including risks of myocardial infarction, death, stroke, bleeding, arrhythmia, dye allergy, renal insufficiency or bleeding.  The patient understands and is willing to proceed.  Possibility of intervention at the same time also discussed  with patient and he understands and is willing to proceed.  Discussed catheterization through the radial approach.  [facility]     Darden PalmerW. Spencer Tilley, Jr.  MD [facility]ACC Cardiology  06/11/2017, 9:54 AM

## 2017-06-12 NOTE — Discharge Summary (Signed)
Discharge Summary    Patient ID: Brett Collier,  MRN: [id number], DOB/AGE: 1966/04/08 51 y.o.  Admit date: 06/10/2017 Discharge date: 06/13/2017  Primary Care Provider: Patient, No Pcp Per Primary Cardiologist: Herbie Baltimore  Discharge Diagnoses    Active Problems:   NSTEMI (non-ST elevated myocardial infarction) [facility])   Diabetes mellitus without complication (HCC)   Hyperlipidemia   Hypertension   Acute systolic heart failure (HCC)   Allergies No Known Allergies  Diagnostic Studies/Procedures    LHC: 06/12/17  Conclusion     Culprit Lesion: Mid LAD lesion, 99 %stenosed.  A STENT SYNERGY DES 2.5X20 drug eluting stent was successfully placed. Post-dilated to 2.75 mm  Post intervention, there is a 0% residual stenosis.  There is mild left ventricular systolic dysfunction. The left ventricular ejection fraction is 45-50% by visual estimate. With distal anterior-anteroapical hypokinesis  LV end diastolic pressure is mildly elevated.   Severe single vessel disease with mid LAD stenosis treated with a single DES stent.  Mildly reduced EF consistent with anterior MI.  Plan:  Transfer to 6 central post procedure unit for ongoing care TR band removal.  Expected discharge tomorrow on dual therapy  Aggressive risk factor modification per primary cardiologist.   TTE: 06/11/17  Study Conclusions  - Left ventricle: The cavity size was normal. Systolic function was   mildly reduced. The estimated ejection fraction was in the range   of 45% to 50%. Hypokinesis of the mid-apicalanteroseptal,   anterior, and apical myocardium. _____________   History of Present Illness     51 y.o.AA male with no known history of CAD but with NIDDM2 presented with intermittent chest pain for last week. He went to urgent care 06/11/17 since he had been having indigestion like symptoms for the last week for which he has been taking zantac which was partially helping his symptoms. At urgent  care he was found to have abnormal ECG with non specific ST depression in the inferior leads and Q waves in the anteroseptal leads. Patient had been having chest pains for the last 2 weeks. Eating and laying down makes the chest pain come. Last chest pain was around 9 PM the night prior to admission. He was sent to [facility] ED where he underwent d-dimer test, trop and ECG. His istat trop was 2.44. He was admitted for NSTEMI.  Hospital Course     Troponin peaked 2.57 this admission. He underwent LHC with Dr. Herbie Baltimore noted above with mLAD stenosis treated with PCI and DES x1. Plan for DAPT with ASA/Brilinta. Post cath labs showed Cr 1.05 and Hgb 14.0. No complications noted post cath. Worked well with cardiac rehab. High dose statin added this admission. LDL 109, Hgb A1c 7.9. Also added metoprolol 25mg  BID.   Was seen by Dr. Clifton James and determined stable for discharge home. Follow up in the office has been arranged. Medications are listed below. Encouraged close follow up with PCP regarding his DM management. Given a work note prior to discharge.  _____________  Discharge Vitals Blood pressure (!) 115/52, pulse 78, temperature 98 F (36.7 C), temperature source Axillary, resp. rate 14, height 5\' 6"  (1.676 m), weight 179 lb 14.3 oz (81.6 kg), SpO2 98 %.  Filed Weights   06/11/17 0308 06/12/17 1135 06/13/17 0129  Weight: 177 lb 11.2 oz (80.6 kg) 177 lb 11.1 oz (80.6 kg) 179 lb 14.3 oz (81.6 kg)    Labs & Radiologic Studies    CBC  Recent Labs  06/12/17 0411 06/13/17 0256  WBC 4.8 5.5  HGB 14.8 14.0  HCT 43.0 41.5  MCV 85.3 85.0  PLT 191 193   Basic Metabolic Panel  Recent Labs  06/11/17 0428 06/13/17 0256  NA 138 136  K 3.6 3.7  CL 102 104  CO2 28 25  GLUCOSE 204* 223*  BUN 19 16  CREATININE 1.08 1.05  CALCIUM 9.2 8.9   Liver Function Tests  Recent Labs  06/11/17 0428  AST 27  ALT 23  ALKPHOS 43  BILITOT 1.0  PROT 6.7  ALBUMIN 3.7   No results for input(s): LIPASE,  AMYLASE in the last 72 hours. Cardiac Enzymes  Recent Labs  06/11/17 0428 06/11/17 0801 06/11/17 1546  TROPONINI 2.57* 2.32* 2.14*   BNP Invalid input(s): POCBNP D-Dimer  Recent Labs  06/10/17 2302  DDIMER <0.27   Hemoglobin A1C  Recent Labs  06/11/17 0428  HGBA1C 7.9*   Fasting Lipid Panel  Recent Labs  06/11/17 0428  CHOL 164  HDL 29*  LDLCALC 109*  TRIG 129  CHOLHDL 5.7   Thyroid Function Tests  Recent Labs  06/11/17 0428  TSH 0.949   _____________  Dg Chest 2 View  Result Date: 06/11/2017 CLINICAL DATA:  Acute onset of mid chest pain.  Initial encounter. EXAM: CHEST  2 VIEW COMPARISON:  None. FINDINGS: The lungs are well-aerated and clear. There is no evidence of focal opacification, pleural effusion or pneumothorax. The heart is normal in size; the mediastinal contour is within normal limits. No acute osseous abnormalities are seen. IMPRESSION: No acute cardiopulmonary process seen. Electronically Signed   By: Roanna Raider M.D.   On: 06/11/2017 00:29   Disposition   Pt is being discharged home today in good condition.  Follow-up Plans & Appointments    Follow-up Information    Azalee Course, [place] Follow up on 06/25/2017.   Specialties:  Cardiology, Radiology Why:  at 10am for your follow up appt.  Contact information: [address] [place] [address] [phone]          Discharge Instructions    AMB Referral to Cardiac Rehabilitation - Phase II    Complete by:  As directed    Diagnosis:   Coronary Stents NSTEMI     Call MD for:  redness, tenderness, or signs of infection (pain, swelling, redness, odor or green/yellow discharge around incision site)    Complete by:  As directed    Diet - low sodium heart healthy    Complete by:  As directed    Discharge instructions    Complete by:  As directed    Radial Site Care Refer to this sheet in the next few weeks. These instructions provide you with information on caring for  yourself after your procedure. Your caregiver may also give you more specific instructions. Your treatment has been planned according to current medical practices, but problems sometimes occur. Call your caregiver if you have any problems or questions after your procedure. HOME CARE INSTRUCTIONS You may shower the day after the procedure.Remove the bandage (dressing) and gently wash the site with plain soap and water.Gently pat the site dry.  Do not apply powder or lotion to the site.  Do not submerge the affected site in water for 3 to 5 days.  Inspect the site at least twice daily.  Do not flex or bend the affected arm for 24 hours.  No lifting over 5 pounds (2.3 kg) for 5 days after your procedure.  Do not drive home  if you are discharged the same day of the procedure. Have someone else drive you.  You may drive 24 hours after the procedure unless otherwise instructed by your caregiver.  What to expect: Any bruising will usually fade within 1 to 2 weeks.  Blood that collects in the tissue (hematoma) may be painful to the touch. It should usually decrease in size and tenderness within 1 to 2 weeks.  SEEK IMMEDIATE MEDICAL CARE IF: You have unusual pain at the radial site.  You have redness, warmth, swelling, or pain at the radial site.  You have drainage (other than a small amount of blood on the dressing).  You have chills.  You have a fever or persistent symptoms for more than 72 hours.  You have a fever and your symptoms suddenly get worse.  Your arm becomes pale, cool, tingly, or numb.  You have heavy bleeding from the site. Hold pressure on the site.    PLEASE MAKE SURE THAT YOU DO NOT MISS ANY DOSES OF YOUR BRILINTA!!!!   Increase activity slowly    Complete by:  As directed       Discharge Medications   Current Discharge Medication List    START taking these medications   Details  aspirin EC 81 MG EC tablet Take 1 tablet (81 mg total) by mouth daily.    atorvastatin  (LIPITOR) 80 MG tablet Take 1 tablet (80 mg total) by mouth daily at 6 PM. Qty: 90 tablet, Refills: 1    metoprolol tartrate (LOPRESSOR) 25 MG tablet Take 1 tablet (25 mg total) by mouth 2 (two) times daily. Qty: 60 tablet, Refills: 2    nitroGLYCERIN (NITROSTAT) 0.4 MG SL tablet Place 1 tablet (0.4 mg total) under the tongue every 5 (five) minutes as needed. Qty: 25 tablet, Refills: 2    ticagrelor (BRILINTA) 90 MG TABS tablet Take 1 tablet (90 mg total) by mouth 2 (two) times daily. Qty: 60 tablet, Refills: 11      CONTINUE these medications which have NOT CHANGED   Details  metFORMIN (GLUCOPHAGE-XR) 500 MG 24 hr tablet Take 1,000 mg by mouth 2 (two) times daily.    pioglitazone (ACTOS) 30 MG tablet Take 30 mg by mouth daily.         Aspirin prescribed at discharge?  Yes High Intensity Statin Prescribed? (Lipitor 40-80mg  or Crestor 20-40mg ): Yes Beta Blocker Prescribed? Yes For EF <40%, was ACEI/ARB Prescribed? No: Consider as outpatient.  ADP Receptor Inhibitor Prescribed? (i.e. Plavix etc.-Includes Medically Managed Patients): Yes For EF <40%, Aldosterone Inhibitor Prescribed? No EF ok Was EF assessed during THIS hospitalization? Yes Was Cardiac Rehab II ordered? (Included Medically managed Patients): Yes   Outstanding Labs/Studies   FLP/LFTs in 6 weeks if tolerating statin.   Duration of Discharge Encounter   Greater than 30 minutes including physician time.  Signed, Laverda PageLindsay Roberts NP-C 06/13/2017, 7:51 AM\  I have personally seen and examined this patient with Laverda PageLindsay Roberts, NP.. I agree with the assessment and plan as outlined above. He was admitted with a NSTEMI. Cardiac cath 06/12/17 with severe mid LAD stenosis treated with a DES x 1. LVEF=45-50% by LV gram. He is doing well this am.  My exam shows:  General: Well developed, well nourished, NAD  HEENT: OP clear, mucus membranes moist  SKIN: warm, dry. No rashes. Neuro: No focal deficits  Musculoskeletal:  Muscle strength 5/5 all ext  Psychiatric: Mood and affect normal  Neck: No JVD, no carotid bruits, no thyromegaly, no  lymphadenopathy.  Lungs:Clear bilaterally, no wheezes, rhonci, crackles Cardiovascular: Regular rate and rhythm. No murmurs, gallops or rubs. Abdomen:Soft. Bowel sounds present. Non-tender.  Extremities: No lower extremity edema. Pulses are 2 + in the bilateral DP/PT. Labs reviewed. EKG reviewed by me with sinus, TWI.  Plan: Will d/c home today. DAPT with ASA/Brilinta for one year. Will continue high dose statin. Will add beta blocker. May add Lisinopril as an outpatient if BP tolerates. Follow up with Dr. Herbie Baltimore. Work excuse until next Wednesday.   Verne Carrow  06/13/2017 7:51 AM

## 2017-06-12 NOTE — Progress Notes (Signed)
Inpatient Diabetes Program Recommendations  AACE/ADA: New Consensus Statement on Inpatient Glycemic Control (2015)  Target Ranges:  Prepandial:   less than 140 mg/dL      Peak postprandial:   less than 180 mg/dL (1-2 hours)      Critically ill patients:  140 - 180 mg/dL   Lab Results  Component Value Date   GLUCAP 203 (H) 06/12/2017   HGBA1C 7.9 (H) 06/11/2017    Review of Glycemic Control:  Results for Mickel DuhamelMATTHEWS, Silvano (MRN [id number]) as of 06/12/2017 09:10  Ref. Range 06/11/2017 07:30 06/11/2017 11:21 06/11/2017 16:22 06/11/2017 20:20 06/12/2017 08:08  Glucose-Capillary Latest Ref Range: 65 - 99 mg/dL 829215 (H) 562227 (H) 130154 (H) 213 (H) 203 (H)   Diabetes history: Type 2 diabetes Outpatient Diabetes medications: Metformin XR 1000 mg bid, Actos 30 mg daily Current orders for Inpatient glycemic control:  Novolog sensitive tid with meals and HS  Inpatient Diabetes Program Recommendations:   Consider adding Lantus 15 units daily while patient is in the hospital.  Thanks, Beryl MeagerJenny Kazuo Durnil, RN, BC-ADM Inpatient Diabetes Coordinator Pager [phone] (8a-5p)

## 2017-06-12 NOTE — Progress Notes (Signed)
ANTICOAGULATION CONSULT NOTE - Follow Up Consult  Pharmacy Consult for heparin Indication: NSTEMI  Labs:  Recent Labs  06/10/17 2145 06/11/17 0428 06/11/17 0801 06/11/17 0811 06/11/17 1546 06/12/17 0411  HGB 14.5 14.4  --   --   --   --   HCT 42.6 42.2  --   --   --   --   PLT 203 187  --   --   --   --   LABPROT  --  14.0  --   --   --   --   INR  --  1.07  --   --   --   --   HEPARINUNFRC  --   --   --  0.24* 0.29* 0.58  CREATININE 1.04 1.08  --   --   --   --   TROPONINI  --  2.57* 2.32*  --  2.14*  --     Assessment/Plan:  51yo male therapeutic on heparin after rate change. Will continue gtt at current rate and confirm stable with additional level.   Vernard GamblesVeronda Arren Laminack, PharmD, BCPS  06/12/2017,4:52 AM

## 2017-06-13 ENCOUNTER — Telehealth: Payer: Self-pay

## 2017-06-13 DIAGNOSIS — I10 Essential (primary) hypertension: Secondary | ICD-10-CM

## 2017-06-13 DIAGNOSIS — I255 Ischemic cardiomyopathy: Secondary | ICD-10-CM

## 2017-06-13 DIAGNOSIS — E785 Hyperlipidemia, unspecified: Secondary | ICD-10-CM

## 2017-06-13 LAB — BASIC METABOLIC PANEL
Anion gap: 7 (ref 5–15)
BUN: 16 mg/dL (ref 6–20)
CALCIUM: 8.9 mg/dL (ref 8.9–10.3)
CHLORIDE: 104 mmol/L (ref 101–111)
CO2: 25 mmol/L (ref 22–32)
CREATININE: 1.05 mg/dL (ref 0.61–1.24)
Glucose, Bld: 223 mg/dL — ABNORMAL HIGH (ref 65–99)
Potassium: 3.7 mmol/L (ref 3.5–5.1)
SODIUM: 136 mmol/L (ref 135–145)

## 2017-06-13 LAB — CBC
HCT: 41.5 % (ref 39.0–52.0)
Hemoglobin: 14 g/dL (ref 13.0–17.0)
MCH: 28.7 pg (ref 26.0–34.0)
MCHC: 33.7 g/dL (ref 30.0–36.0)
MCV: 85 fL (ref 78.0–100.0)
PLATELETS: 193 10*3/uL (ref 150–400)
RBC: 4.88 MIL/uL (ref 4.22–5.81)
RDW: 12.4 % (ref 11.5–15.5)
WBC: 5.5 10*3/uL (ref 4.0–10.5)

## 2017-06-13 LAB — POCT ACTIVATED CLOTTING TIME: Activated Clotting Time: 274 seconds

## 2017-06-13 LAB — GLUCOSE, CAPILLARY: GLUCOSE-CAPILLARY: 224 mg/dL — AB (ref 65–99)

## 2017-06-13 MED ORDER — TICAGRELOR 90 MG PO TABS: 90.0000 mg | ORAL_TABLET | Freq: Two times a day (BID) | ORAL | 0 refills | Status: DC

## 2017-06-13 MED ORDER — ATORVASTATIN CALCIUM 80 MG PO TABS: 80.0000 mg | ORAL_TABLET | Freq: Every day | ORAL | 1 refills | Status: DC

## 2017-06-13 MED ORDER — METOPROLOL TARTRATE 25 MG PO TABS: 25.0000 mg | ORAL_TABLET | Freq: Two times a day (BID) | ORAL | 2 refills | Status: DC

## 2017-06-13 MED ORDER — METOPROLOL TARTRATE 25 MG PO TABS
25.0000 mg | ORAL_TABLET | Freq: Two times a day (BID) | ORAL | Status: DC
  Administered 2017-06-13: 25 mg via ORAL
  Filled 2017-06-13: qty 1

## 2017-06-13 MED ORDER — ASPIRIN 81 MG PO TBEC: 81.0000 mg | DELAYED_RELEASE_TABLET | Freq: Every day | ORAL | Status: DC

## 2017-06-13 MED ORDER — NITROGLYCERIN 0.4 MG SL SUBL: 0.4000 mg | SUBLINGUAL_TABLET | SUBLINGUAL | 2 refills | Status: AC | PRN

## 2017-06-13 MED ORDER — TICAGRELOR 90 MG PO TABS: 90.0000 mg | ORAL_TABLET | Freq: Two times a day (BID) | ORAL | 11 refills | Status: DC

## 2017-06-13 NOTE — Care Management Note (Signed)
Case Management Note  Patient Details  Name: Brett DuhamelRodney Collier MRN: [id number] Date of Birth: Mar 27, 1966  Subjective/Objective:  Pt presented for Nstemi. Pt post cath with PCI. Plan for home today with Brilinta. [facility] [place]                  Action/Plan: CM did call [facility] for Co pay $35.00. Pt is aware. Co pay card provided and Pt will be able to afford. No further needs from CM at this time.   Expected Discharge Date:  06/13/17               Expected Discharge Plan:  Home/Self Care  In-House Referral:  NA  Discharge planning Services  CM Consult, Medication Assistance  Post Acute Care Choice:  NA Choice offered to:  NA  DME Arranged:  N/A DME Agency:  NA  HH Arranged:  NA HH Agency:  NA  Status of Service:  Completed, signed off  If discussed at Long Length of Stay Meetings, dates discussed:    Additional Comments:  Gala LewandowskyGraves-Bigelow, Shameka Aggarwal Kaye, RN 06/13/2017, 9:09 AM

## 2017-06-13 NOTE — Progress Notes (Signed)
CARDIAC REHAB PHASE I   PRE:  Rate/Rhythm: 85 SR  BP:  Supine: 126/81  Sitting:   Standing:    SaO2:   MODE:  Ambulation: 800 ft   POST:  Rate/Rhythm: 94 SR  BP:  Supine:   Sitting: 139/8  Standing:    SaO2:  0815-0910 Pt walked 800 ft with steady gait. No CP. Tolerated well. MI education completed with pt who voiced understanding. Stressed importance of brilinta with stent. Needs to see case manager. Gave diabetic and heart healthy diets. Discussed NTG use, risk factors, ex ed and CRP 2. Will refer to [place].  Luetta Nuttingharlene Jaaziah Schulke, RN BSN  06/13/2017 9:08 AM

## 2017-06-13 NOTE — Telephone Encounter (Signed)
Call placed to Pt.  Pt discharged this AM.  Confirmed Pt had obtained all prescriptions.  Pt states he has.  Notified Pt I would call him on Monday to see how he was progressing.  Pt sounded tired.  Pt thanked this nurse for call.  Will check up on Monday.

## 2017-06-13 NOTE — Telephone Encounter (Signed)
Per review of Pt chart, anticipate DC today 06/13/2017.  Pt inpt at this time.  Will continue to monitor for TCM call.

## 2017-06-13 NOTE — Telephone Encounter (Signed)
-----   Message from Arty BaumgartnerLindsay B Roberts, NP sent at 06/13/2017  8:29 AM EDT ----- Regarding: TOC f/u Needs TOC f/u call  Thx Laverda PageLindsay Roberts

## 2017-06-16 NOTE — Telephone Encounter (Signed)
TCM call made to Pt.  Call went to VM.  Left generic message requesting call back.

## 2017-06-17 ENCOUNTER — Telehealth: Payer: Self-pay | Admitting: Cardiology

## 2017-06-17 NOTE — Telephone Encounter (Signed)
Received FMLA forms from [employer].  Received via Fax.  Sent to [employer] @ [employer] for letter/pkt to be mailed. lp

## 2017-06-17 NOTE — Telephone Encounter (Signed)
Follow up ° ° °Pt calling back °

## 2017-06-17 NOTE — Telephone Encounter (Signed)
Patient contacted regarding [facility] on [facility].  Patient understands to follow up with provider Azalee CourseHao Meng on 06/25/2017 at 1000 at [facility]. Patient understands discharge instructions? Pt states yes.  Discussed with Pt radial care.  Pt c/o of some continuing soreness to right arm.  Denies large bruise or numbness/tingling of fingers.  Notified Pt he could take tylenol for pain.  Pt states discharging doctor told him he could have Aleve.  Educated Pt that Aleve could contribute to making blood thinner.  Advised to use sparingly and try to use Tylenol for pain at this time.  Pt indicates understanding.  Patient understands medications and regiment? yes Patient understands to bring all medications to this visit? yes  Pt states he has had some neck pain, feels like a "kink".  States it is in the middle of the back of his neck.  Overall Pt recovering OK.  Will have sister drive him to his f/u appt.

## 2017-06-24 NOTE — Progress Notes (Signed)
Cardiology Office Note    Date:  06/25/2017   ID:  Brett DuhamelRodney Collier, DOB Jan 12, 1966, MRN [id number]  PCP:  Lonie Peakonroy, Nathan, PA-C  Cardiologist:  Dr. Herbie BaltimoreHarding  Chief Complaint  Patient presents with  . Transitions Of Care    2 weeks TCM, seen for Dr. Herbie BaltimoreHarding,     History of Present Illness:  Brett DuhamelRodney Collier is a 51 y.o. male with PMH of HLD and DM II presented with to the hospital recently with NSTEMI. Echocardiogram obtained on 06/11/2017 showed EF 45-50%, hypokinesis of the mid apical anteroseptal, anterior and apical myocardium. Cardiac catheterization performed on the following day showed severe single vessel CAD with 99% mid LAD lesion treated with Synergy 2.5 x 20 mm DES postdilated to 2.75 mm, EF 45-50%. Hemoglobin A1c obtained in the hospital was 7.9. He was placed on high-dose statin during this admission, LDL was 109. After cardiac catheterization he was also placed on aspirin and Brilinta.  He presents today for cardiology office visit. 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 He says yesterday around 3 PM, after he finished work, he did have roughly 15 minutes of chest pain. He is no longer having the same chest pain today. EKG today showed very deep T-wave inversion in the anterior lead and also minimal J-point elevation in V2. I did review of the EKG with DOD Dr. SwazilandJordan, who felt this is likely revolutionary changes after recent MI. We will obtain a troponin today, if troponin went up, he will need to be evaluated in the emergency room. If troponin went down, no further workup is needed and I'll see him in 3 weeks.    Past Medical History:  Diagnosis Date  . CAD (coronary artery disease), native coronary artery    06/12/17 PCI with DESx1 to mLAD.   . Diabetes (HCC)   . Hyperlipemia     Past Surgical History:  Procedure Laterality Date  . CORONARY STENT INTERVENTION N/A 06/12/2017   Procedure: Coronary Stent Intervention;   Surgeon: Marykay LexHarding, David W, MD;  [facility];  Service: Cardiovascular;  Laterality: N/A;  . LEFT HEART CATH AND CORONARY ANGIOGRAPHY N/A 06/12/2017   Procedure: Left Heart Cath and Coronary Angiography;  Surgeon: Marykay LexHarding, David W, MD;  [facility];  Service: Cardiovascular;  Laterality: N/A;    Current Medications: Outpatient Medications Prior to Visit  Medication Sig Dispense Refill  . aspirin EC 81 MG EC tablet Take 1 tablet (81 mg total) by mouth daily.    Marland Kitchen. atorvastatin (LIPITOR) 80 MG tablet Take 1 tablet (80 mg total) by mouth daily at 6 PM. 90 tablet 1  . metFORMIN (GLUCOPHAGE-XR) 500 MG 24 hr tablet Take 1,000 mg by mouth 2 (two) times daily.    . metoprolol tartrate (LOPRESSOR) 25 MG tablet Take 1 tablet (25 mg total) by mouth 2 (two) times daily. 60 tablet 2  . nitroGLYCERIN (NITROSTAT) 0.4 MG SL tablet Place 1 tablet (0.4 mg total) under the tongue every 5 (five) minutes as needed. 25 tablet 2  . pioglitazone (ACTOS) 30 MG tablet Take 30 mg by mouth daily.    . ticagrelor (BRILINTA) 90 MG TABS tablet Take 1 tablet (90 mg total) by mouth 2 (two) times daily. 60 tablet 11   No facility-administered medications prior to visit.      Allergies:   Patient has no known allergies.   Social History   Social History  .  Marital status: Married    Spouse name: N/A  . Number of children: N/A  . Years of education: N/A   Social History Main Topics  . Smoking status: Never Smoker  . Smokeless tobacco: Never Used  . Alcohol use No  . Drug use: No  . Sexual activity: Not Asked   Other Topics Concern  . None   Social History Narrative  . None     Family History:  The patient's family history includes Heart Problems in his mother; Heart attack (age of onset: 59) in his maternal grandfather; High blood pressure in his brother.   ROS:   Please see the history of present illness.    ROS All other systems reviewed and are negative.   PHYSICAL  EXAM:   VS:  BP 114/68 (BP Location: Right Arm)   Pulse 60   Ht 5\' 6"  (1.676 m)   Wt 179 lb 9.6 oz (81.5 kg)   BMI 28.99 kg/m    GEN: Well nourished, well developed, in no acute distress  HEENT: normal  Neck: no JVD, carotid bruits, or masses Cardiac: RRR; no murmurs, rubs, or gallops,no edema  Respiratory:  clear to auscultation bilaterally, normal work of breathing GI: soft, nontender, nondistended, + BS MS: no deformity or atrophy  Skin: warm and dry, no rash Neuro:  Alert and Oriented x 3, Strength and sensation are intact Psych: euthymic mood, full affect  Wt Readings from Last 3 Encounters:  06/25/17 179 lb 9.6 oz (81.5 kg)  06/13/17 179 lb 14.3 oz (81.6 kg)      Studies/Labs Reviewed:   EKG:  EKG is ordered today.  The ekg ordered today demonstrates Normal sinus rhythm, J-point elevation in V2, deep T-wave inversion in anterior leads  Recent Labs: 06/11/2017: ALT 23; B Natriuretic Peptide 212.5; TSH 0.949 06/13/2017: BUN 16; Creatinine, Ser 1.05; Hemoglobin 14.0; Platelets 193; Potassium 3.7; Sodium 136   Lipid Panel    Component Value Date/Time   CHOL 164 06/11/2017 0428   TRIG 129 06/11/2017 0428   HDL 29 (L) 06/11/2017 0428   CHOLHDL 5.7 06/11/2017 0428   VLDL 26 06/11/2017 0428   LDLCALC 109 (H) 06/11/2017 0428    Additional studies/ records that were reviewed today include:   Echo 06/11/2017 LV EF: 45% -   50%  Study Conclusions  - Left ventricle: The cavity size was normal. Systolic function was   mildly reduced. The estimated ejection fraction was in the range   of 45% to 50%. Hypokinesis of the mid-apicalanteroseptal,   anterior, and apical myocardium.     Cath 06/12/2017 Conclusion     Culprit Lesion: Mid LAD lesion, 99 %stenosed.  A STENT SYNERGY DES 2.5X20 drug eluting stent was successfully placed. Post-dilated to 2.75 mm  Post intervention, there is a 0% residual stenosis.  There is mild left ventricular systolic dysfunction. The left  ventricular ejection fraction is 45-50% by visual estimate. With distal anterior-anteroapical hypokinesis  LV end diastolic pressure is mildly elevated.   Severe single vessel disease with mid LAD stenosis treated with a single DES stent.  Mildly reduced EF consistent with anterior MI.  Plan:  Transfer to 6 central post procedure unit for ongoing care TR band removal.  Expected discharge tomorrow on dual therapy  Aggressive risk factor modification per primary cardiologist      ASSESSMENT:    1. Coronary artery disease involving native coronary artery of native heart without angina pectoris   2. NSTEMI (non-ST elevated myocardial infarction) (  HCC)   3. Medication management   4. Hyperlipidemia, unspecified hyperlipidemia type   5. Controlled type 2 diabetes mellitus without complication, without long-term current use of insulin (HCC)      PLAN:  In order of problems listed above:  1. CAD: Status post recent DES to mid LAD, he has been compliant with aspirin and Brilinta. Deep T-wave inversion in anterior leads, he did have an episode of chest pain yesterday. I have reviewed EKG with Dr. Swaziland DOD, plan to obtain a stat troponin today, if negative, he can follow-up in 3 weeks. If positive, that he will need to be send to the ED for potential relook cardiac catheterization.  2. HLD: Continue Lipitor 80 mg daily. Fasting lipid panel and LFTs in 6-8 weeks.  3. DM II: Continue metformin. Managed by primary care physician.    Medication Adjustments/Labs and Tests Ordered: Current medicines are reviewed at length with the patient today.  Concerns regarding medicines are outlined above.  Medication changes, Labs and Tests ordered today are listed in the Patient Instructions below. Patient Instructions  Medication Instructions:  Your physician recommends that you continue on your current medications as directed. Please refer to the Current Medication list given to you today. If  you need a refill on your cardiac medications before your next appointment, please call your pharmacy.  Labwork: STAT TROPONIN DOWNSTAIRS AT [facility] -AND- FLP AND LFT IN 6-8 WEEKS   Follow-Up: Your physician wants you to follow-up in: 3 WEEKS WITH Melesio Madara,PA-C AND 2-3 MONTHS WITH DR HARDING.  Thank you for choosing [facility] at [employer], [place]  06/25/2017 6:40 PM    [facility] [address] [place], [place], [place]  [address] Phone: [phone]; Fax: [phone]

## 2017-06-25 ENCOUNTER — Ambulatory Visit (INDEPENDENT_AMBULATORY_CARE_PROVIDER_SITE_OTHER): Payer: BLUE CROSS/BLUE SHIELD | Admitting: Physician Assistant

## 2017-06-25 ENCOUNTER — Encounter: Payer: Self-pay | Admitting: Physician Assistant

## 2017-06-25 ENCOUNTER — Telehealth: Payer: Self-pay | Admitting: Cardiology

## 2017-06-25 VITALS — BP 114/68 | HR 60 | Ht 66.0 in | Wt 179.6 lb

## 2017-06-25 DIAGNOSIS — I2510 Atherosclerotic heart disease of native coronary artery without angina pectoris: Secondary | ICD-10-CM | POA: Diagnosis not present

## 2017-06-25 DIAGNOSIS — E785 Hyperlipidemia, unspecified: Secondary | ICD-10-CM

## 2017-06-25 DIAGNOSIS — E119 Type 2 diabetes mellitus without complications: Secondary | ICD-10-CM

## 2017-06-25 DIAGNOSIS — Z79899 Other long term (current) drug therapy: Secondary | ICD-10-CM

## 2017-06-25 DIAGNOSIS — I214 Non-ST elevation (NSTEMI) myocardial infarction: Secondary | ICD-10-CM | POA: Diagnosis not present

## 2017-06-25 LAB — TROPONIN I: TROPONIN I: 0.01 ng/mL (ref <=0.05)

## 2017-06-25 NOTE — Addendum Note (Signed)
Addended byLisabeth Devoid: Wynne Jury on: 06/25/2017 07:01 PM   Modules accepted: Level of Service

## 2017-06-25 NOTE — Telephone Encounter (Signed)
Brett Collier calling with [facility] and wanted to report that stat labs are available.

## 2017-06-25 NOTE — Telephone Encounter (Signed)
Azalee CourseHao Meng PA made aware.

## 2017-06-25 NOTE — Patient Instructions (Signed)
Medication Instructions:  Your physician recommends that you continue on your current medications as directed. Please refer to the Current Medication list given to you today. If you need a refill on your cardiac medications before your next appointment, please call your pharmacy.  Labwork: STAT TROPONIN DOWNSTAIRS AT [facility] -AND- FLP AND LFT IN 6-8 WEEKS   Follow-Up: Your physician wants you to follow-up in: 3 WEEKS WITH HAO MENG,PA-C AND 2-3 MONTHS WITH DR HARDING.  Thank you for choosing [facility]!!

## 2017-07-15 ENCOUNTER — Ambulatory Visit (INDEPENDENT_AMBULATORY_CARE_PROVIDER_SITE_OTHER): Payer: BLUE CROSS/BLUE SHIELD | Admitting: Physician Assistant

## 2017-07-15 ENCOUNTER — Encounter: Payer: Self-pay | Admitting: Physician Assistant

## 2017-07-15 VITALS — BP 126/72 | HR 68 | Ht 66.5 in | Wt 180.0 lb

## 2017-07-15 DIAGNOSIS — I25118 Atherosclerotic heart disease of native coronary artery with other forms of angina pectoris: Secondary | ICD-10-CM

## 2017-07-15 DIAGNOSIS — E119 Type 2 diabetes mellitus without complications: Secondary | ICD-10-CM

## 2017-07-15 DIAGNOSIS — E785 Hyperlipidemia, unspecified: Secondary | ICD-10-CM

## 2017-07-15 NOTE — Progress Notes (Signed)
Cardiology Office Note    Date:  07/16/2017   ID:  Boubacar Lerette, DOB August 03, 1966, MRN [id number]  PCP:  Lonie Peak, PA-C  Cardiologist:  Dr. Herbie Baltimore  Chief Complaint  Patient presents with  . Follow-up    3 week. Seen for Dr. Herbie Baltimore.  . Shortness of Breath    History of Present Illness:  Brett Collier is a 51 y.o. male  with PMH of HLD and DM II presented with to the hospital recently with NSTEMI. Echocardiogram obtained on 06/11/2017 showed EF 45-50%, hypokinesis of the mid apical anteroseptal, anterior and apical myocardium. Cardiac catheterization performed on the following day showed severe single vessel CAD with 99% mid LAD lesion treated with Synergy 2.5 x 20 mm DES postdilated to 2.75 mm, EF 45-50%. Hemoglobin A1c obtained in the hospital was 7.9. He was placed on high-dose statin during this admission, LDL was 109. After cardiac catheterization he was also placed on aspirin and Brilinta.  I last saw the patient on 06/25/2017, he did have an episode of chest discomfort after going back to work. EKG showed deep T-wave inversions anteriorly and a minimal J-point elevation in V2. I reviewed the EKG with Dr. Swaziland who felt this is likely illusionary changes after recent MI. We did obtain troponin on that day which came back negative. He returned today for office visit follow-up. He continued to have intermittent chest discomfort which he described as a soreness in the chest. His symptoms to worsen when he stretches arm and puff out his chest. However he denies any worsening when I pressed on the area. I suggested adding a low-dose amlodipine for antianginal purposes, he does not wish to do this at this time. We will not be able to add any long acting nitrate as he take Viagra.    Past Medical History:  Diagnosis Date  . CAD (coronary artery disease), native coronary artery    06/12/17 PCI with DESx1 to mLAD.   . Diabetes (HCC)   . Hyperlipemia     Past Surgical History:    Procedure Laterality Date  . CORONARY STENT INTERVENTION N/A 06/12/2017   Procedure: Coronary Stent Intervention;  Surgeon: Marykay Lex, MD;  Location: [facility] INVASIVE CV LAB;  Service: Cardiovascular;  Laterality: N/A;  . LEFT HEART CATH AND CORONARY ANGIOGRAPHY N/A 06/12/2017   Procedure: Left Heart Cath and Coronary Angiography;  Surgeon: Marykay Lex, MD;  Location: [facility] INVASIVE CV LAB;  Service: Cardiovascular;  Laterality: N/A;    Current Medications: Outpatient Medications Prior to Visit  Medication Sig Dispense Refill  . aspirin EC 81 MG EC tablet Take 1 tablet (81 mg total) by mouth daily.    Marland Kitchen atorvastatin (LIPITOR) 80 MG tablet Take 1 tablet (80 mg total) by mouth daily at 6 PM. 90 tablet 1  . metFORMIN (GLUCOPHAGE-XR) 500 MG 24 hr tablet Take 1,000 mg by mouth 2 (two) times daily.    . metoprolol tartrate (LOPRESSOR) 25 MG tablet Take 1 tablet (25 mg total) by mouth 2 (two) times daily. 60 tablet 2  . nitroGLYCERIN (NITROSTAT) 0.4 MG SL tablet Place 1 tablet (0.4 mg total) under the tongue every 5 (five) minutes as needed. 25 tablet 2  . pioglitazone (ACTOS) 30 MG tablet Take 30 mg by mouth daily.    . ticagrelor (BRILINTA) 90 MG TABS tablet Take 1 tablet (90 mg total) by mouth 2 (two) times daily. 60 tablet 11   No facility-administered medications prior to visit.  Allergies:   Patient has no known allergies.   Social History   Social History  . Marital status: Married    Spouse name: N/A  . Number of children: N/A  . Years of education: N/A   Social History Main Topics  . Smoking status: Never Smoker  . Smokeless tobacco: Never Used  . Alcohol use No  . Drug use: No  . Sexual activity: Not Asked   Other Topics Concern  . None   Social History Narrative  . None     Family History:  The patient's family history includes Heart Problems in his mother; Heart attack (age of onset: 2845) in his maternal grandfather; High blood pressure in his brother.   ROS:    Please see the history of present illness.    ROS All other systems reviewed and are negative.   PHYSICAL EXAM:   VS:  BP 126/72   Pulse 68   Ht 5' 6.5" (1.689 m)   Wt 180 lb (81.6 kg)   BMI 28.62 kg/m    GEN: Well nourished, well developed, in no acute distress  HEENT: normal  Neck: no JVD, carotid bruits, or masses Cardiac: RRR; no murmurs, rubs, or gallops,no edema  Respiratory:  clear to auscultation bilaterally, normal work of breathing GI: soft, nontender, nondistended, + BS MS: no deformity or atrophy  Skin: warm and dry, no rash Neuro:  Alert and Oriented x 3, Strength and sensation are intact Psych: euthymic mood, full affect  Wt Readings from Last 3 Encounters:  07/15/17 180 lb (81.6 kg)  06/25/17 179 lb 9.6 oz (81.5 kg)  06/13/17 179 lb 14.3 oz (81.6 kg)      Studies/Labs Reviewed:   EKG:  EKG is not ordered today.    Recent Labs: 06/11/2017: ALT 23; B Natriuretic Peptide 212.5; TSH 0.949 06/13/2017: BUN 16; Creatinine, Ser 1.05; Hemoglobin 14.0; Platelets 193; Potassium 3.7; Sodium 136   Lipid Panel    Component Value Date/Time   CHOL 164 06/11/2017 0428   TRIG 129 06/11/2017 0428   HDL 29 (L) 06/11/2017 0428   CHOLHDL 5.7 06/11/2017 0428   VLDL 26 06/11/2017 0428   LDLCALC 109 (H) 06/11/2017 0428    Additional studies/ records that were reviewed today include:   Echo 06/11/2017 LV EF: 45% -   50%  Study Conclusions  - Left ventricle: The cavity size was normal. Systolic function was   mildly reduced. The estimated ejection fraction was in the range   of 45% to 50%. Hypokinesis of the mid-apicalanteroseptal,   anterior, and apical myocardium.     Myoview 06/12/2017 Conclusion     Culprit Lesion: Mid LAD lesion, 99 %stenosed.  A STENT SYNERGY DES 2.5X20 drug eluting stent was successfully placed. Post-dilated to 2.75 mm  Post intervention, there is a 0% residual stenosis.  There is mild left ventricular systolic dysfunction. The left  ventricular ejection fraction is 45-50% by visual estimate. With distal anterior-anteroapical hypokinesis  LV end diastolic pressure is mildly elevated.   Severe single vessel disease with mid LAD stenosis treated with a single DES stent.  Mildly reduced EF consistent with anterior MI.  Plan:  Transfer to 6 central post procedure unit for ongoing care TR band removal.  Expected discharge tomorrow on dual therapy  Aggressive risk factor modification per primary cardiologist.      ASSESSMENT:    1. Coronary artery disease involving native coronary artery of native heart with other form of angina pectoris (HCC)  2. Hyperlipidemia, unspecified hyperlipidemia type   3. Controlled type 2 diabetes mellitus without complication, without long-term current use of insulin (HCC)      PLAN:  In order of problems listed above:  1. CAD: Continued to have intermittent chest discomfort, I wanted to add a low-dose amlodipine, he did not wish to do this at this time. His chest pain is described as a soreness in the chest worse when he stretched out his arm and rotated the body. But it is not affected by palpation interestingly. Continue observation at this time.  2. Hyperlipidemia: Plan for outpatient LFT and fasting lipid panel that was ordered during the last visit. Continue high-dose Lipitor.  3. DM II: Continue metformin, managed by primary care provider.    Medication Adjustments/Labs and Tests Ordered: Current medicines are reviewed at length with the patient today.  Concerns regarding medicines are outlined above.  Medication changes, Labs and Tests ordered today are listed in the Patient Instructions below. Patient Instructions  Medication Instructions:   No changes  Labwork:   None  Testing/Procedures:  None  Follow-Up:  With Dr. Herbie Baltimore as scheduled.  If you need a refill on your cardiac medications before your next appointment, please call your pharmacy.       Ramond Dial, [place]  07/16/2017 11:47 PM    [facility] [address] [place], [place], [place]  [address] Phone: [phone]; Fax: [phone]

## 2017-07-15 NOTE — Patient Instructions (Signed)
Medication Instructions:   No changes  Labwork:   None  Testing/Procedures:  None  Follow-Up:  With Dr. Herbie BaltimoreHarding as scheduled.  If you need a refill on your cardiac medications before your next appointment, please call your pharmacy.

## 2017-07-16 ENCOUNTER — Encounter: Payer: Self-pay | Admitting: Physician Assistant

## 2017-08-12 DIAGNOSIS — Z0000 Encounter for general adult medical examination without abnormal findings: Secondary | ICD-10-CM | POA: Diagnosis not present

## 2017-08-12 DIAGNOSIS — Z6823 Body mass index (BMI) 23.0-23.9, adult: Secondary | ICD-10-CM | POA: Diagnosis not present

## 2017-08-12 DIAGNOSIS — E119 Type 2 diabetes mellitus without complications: Secondary | ICD-10-CM | POA: Diagnosis not present

## 2017-08-12 DIAGNOSIS — I2510 Atherosclerotic heart disease of native coronary artery without angina pectoris: Secondary | ICD-10-CM | POA: Diagnosis not present

## 2017-08-15 NOTE — Progress Notes (Signed)
Liver function good, but potassium borderline high, recommend discuss this with PCP as I do not see any medication under his medication list that can raise the potassium level.

## 2017-08-15 NOTE — Progress Notes (Signed)
Outside record reviewed, cholesterol good except the HDL (good cholesterol) is low, recommend increase activity level as tolerated.

## 2017-08-21 ENCOUNTER — Ambulatory Visit (INDEPENDENT_AMBULATORY_CARE_PROVIDER_SITE_OTHER): Payer: BLUE CROSS/BLUE SHIELD | Admitting: Cardiology

## 2017-08-21 VITALS — BP 128/70 | HR 64 | Ht 66.5 in | Wt 182.0 lb

## 2017-08-21 DIAGNOSIS — Z9861 Coronary angioplasty status: Secondary | ICD-10-CM | POA: Diagnosis not present

## 2017-08-21 DIAGNOSIS — E785 Hyperlipidemia, unspecified: Secondary | ICD-10-CM

## 2017-08-21 DIAGNOSIS — R5383 Other fatigue: Secondary | ICD-10-CM | POA: Diagnosis not present

## 2017-08-21 DIAGNOSIS — E119 Type 2 diabetes mellitus without complications: Secondary | ICD-10-CM | POA: Diagnosis not present

## 2017-08-21 DIAGNOSIS — I2510 Atherosclerotic heart disease of native coronary artery without angina pectoris: Secondary | ICD-10-CM

## 2017-08-21 DIAGNOSIS — I10 Essential (primary) hypertension: Secondary | ICD-10-CM

## 2017-08-21 MED ORDER — METOPROLOL TARTRATE 25 MG PO TABS: ORAL_TABLET | ORAL | 3 refills | Status: DC

## 2017-08-21 MED ORDER — ATORVASTATIN CALCIUM 40 MG PO TABS: 40.0000 mg | ORAL_TABLET | Freq: Every day | ORAL | 3 refills | Status: DC

## 2017-08-21 MED ORDER — ATORVASTATIN CALCIUM 80 MG PO TABS: 80.0000 mg | ORAL_TABLET | Freq: Every day | ORAL | 3 refills | Status: DC

## 2017-08-21 NOTE — Patient Instructions (Addendum)
Medication instructions  Decrease Atorvastatin to 40 mg daily - take at bedtime   decrease metoprolol tartrate to 12.5 mg ( 1/2 tablet of 25 mg) twice a day for 2 weeks then  Change to 12.5 mg in the morning and 25 mg in the evening. If can not tolerate decrease back to 12.5 mg twice a day     Your physician wants you to follow-up in Jan 2019 with Dr Herbie BaltimoreHARDING. You will receive a reminder letter in the mail two months in advance. If you don't receive a letter, please call our office to schedule the follow-up appointment.    If you need a refill on your cardiac medications before your next appointment, please call your pharmacy.

## 2017-08-21 NOTE — Progress Notes (Signed)
PCP: Lonie Peak, PA-C  Clinic Note: Chief Complaint  Patient presents with  . Follow-up    3 months  . Coronary Artery Disease    PCI to LAD in setting of NSTEMI    HPI: Brett Collier is a 51 y.o. male with a PMH below who presents today for 1 month f/u . He had a history of non-STEMI back on July 4. EF 45-50% with anteroseptal and anteroapical hypokinesis. He had a LAD lesion treated with DES stent. He was noted to be probably diabetic in his hospitalization.[name] was last seen on August 7 by Azalee Course first shortness of breath. - He was initially seen in follow-up on July 18 and had one episode of chest pain after limited work. He had evolutionary changes on his EKG but no acute changes. He has had intermittent chest discomfort as a soreness in his chest. Denied any anginal type symptoms.  Recent Hospitalizations:   06/10/2017: Admitted with non-STEMI - > PCI to LAD. Also diagnosed with diabetes  Studies Personally Reviewed - (if available, images/films reviewed: From Epic Chart or Care Everywhere)  Left Heart Catheterization and Coronary Angiography/Coronary Stent Intervention Culprit Lesion: Mid LAD lesion, 99 %stenosed.  A STENT SYNERGY DES 2.5X20 drug eluting stent was successfully placed. Post-dilated to 2.75 mm  Post intervention, there is a 0% residual stenosis.  There is mild left ventricular systolic dysfunction. The left ventricular ejection fraction is 45-50% by visual estimate. With distal anterior-anteroapical hypokinesis  LV end diastolic pressure is mildly elevated. Severe single vessel disease with mid LAD stenosis treated with a single DES stent.  Mildly reduced EF consistent with anterior MI.  Wall Motion       Diagnostic Diagram     Post-Intervention Diagram       2 D Echo (in stetting of LAD NSTEMI): EF 45-50%. Hypokinesis of mid-anteroseptal & anterior/antero-apical wall.  Interval History: Brett Collier returns today mostly noting "feeling  tired" with less energy & libido".  He always feels tired & sleepy -- when he gets home from work, he "just wants to sleep."  He was told by a family member (who works in medicine) that this was because of his Metoprolol.  Interestingly, he still notes intermittent episodes where his heart rate goes up for no reason (i.e. No stress or exercise). He says that has random occurrences of breathing "funny".    He has not had any further chest tightness/pressure or dyspnea with rest or exertion.  Nothing like his MI related angina. He has been trying to do more walking than pre-MI. No PND, orthopnea or edema.  No palpitations, weakness or syncope/near syncope. No TIA/amaurosis fugax symptoms. No melena, hematochezia, hematuria, or epstaxis. No claudication.  ROS: A comprehensive was performed. Review of Systems  Constitutional: Positive for malaise/fatigue.  HENT: Negative for congestion.   Respiratory: Positive for shortness of breath.   Cardiovascular: Positive for palpitations.  Gastrointestinal: Negative for abdominal pain and heartburn.  Musculoskeletal: Negative for joint pain.  Neurological: Positive for dizziness (standing up quickly). Negative for weakness.  Endo/Heme/Allergies: Does not bruise/bleed easily.  Psychiatric/Behavioral: Negative for depression.  All other systems reviewed and are negative.   I have reviewed and (if needed) personally updated the patient's problem list, medications, allergies, past medical and surgical history, social and family history.   Past Medical History:  Diagnosis Date  . CAD (coronary artery disease), native coronary artery    06/12/17 PCI with STENT SYNERGY DES 2.5X20 to mLAD.   Brett Collier  Diabetes (HCC)   . Hyperlipemia     Past Surgical History:  Procedure Laterality Date  . CORONARY STENT INTERVENTION N/A 06/12/2017   Procedure: Coronary Stent Intervention;  Surgeon: Marykay Lex, MD;  Location: [facility] INVASIVE CV LAB: PCI to mLAD 99%-0%: STENT  SYNERGY DES 2.5X20 (2.75 mm)  . LEFT HEART CATH AND CORONARY ANGIOGRAPHY N/A 06/12/2017   Procedure: Left Heart Cath and Coronary Angiography;  Surgeon: Marykay Lex, MD;  Location: [facility] INVASIVE CV LAB: mLAd 99%, diffuse mild disease in RCA. EF ~45% with antero-apical hypokinesis & mild LVEDP elevation.    Current Meds  Medication Sig  . aspirin EC 81 MG EC tablet Take 1 tablet (81 mg total) by mouth daily.  Brett Collier atorvastatin (LIPITOR) 80 MG tablet Take 1 tablet (80 mg total) by mouth daily at 6 PM.  . metFORMIN (GLUCOPHAGE-XR) 500 MG 24 hr tablet Take 1,000 mg by mouth 2 (two) times daily.  . metoprolol tartrate (LOPRESSOR) 25 MG tablet Take as directed.  . nitroGLYCERIN (NITROSTAT) 0.4 MG SL tablet Place 1 tablet (0.4 mg total) under the tongue every 5 (five) minutes as needed.  . pioglitazone (ACTOS) 30 MG tablet Take 30 mg by mouth daily.  . ticagrelor (BRILINTA) 90 MG TABS tablet Take 1 tablet (90 mg total) by mouth 2 (two) times daily.  . [DISCONTINUED] atorvastatin (LIPITOR) 80 MG tablet Take 1 tablet (80 mg total) by mouth daily at 6 PM.  . [DISCONTINUED] metoprolol tartrate (LOPRESSOR) 25 MG tablet Take 1 tablet (25 mg total) by mouth 2 (two) times daily.    No Known Allergies  Social History   Social History  . Marital status: Married    Spouse name: N/A  . Number of children: N/A  . Years of education: N/A   Social History Main Topics  . Smoking status: Never Smoker  . Smokeless tobacco: Never Used  . Alcohol use No  . Drug use: No  . Sexual activity: Not Asked   Other Topics Concern  . None   Social History Narrative  . None    family history includes Heart Problems in his mother; Heart attack (age of onset: 38) in his maternal grandfather; High blood pressure in his brother.  Wt Readings from Last 3 Encounters:  08/21/17 182 lb (82.6 kg)  07/15/17 180 lb (81.6 kg)  06/25/17 179 lb 9.6 oz (81.5 kg)    PHYSICAL EXAM BP 128/70   Pulse 64   Ht 5' 6.5"  (1.689 m)   Wt 182 lb (82.6 kg)   BMI 28.94 kg/m  Physical Exam  Constitutional: He is oriented to person, place, and time. He appears well-developed and well-nourished. No distress.  Healthy appearing. Well groomed.  HENT:  Head: Normocephalic and atraumatic.  Eyes: EOM are normal.  Neck: Normal range of motion. Neck supple. No hepatojugular reflux and no JVD present. Carotid bruit is not present.  Cardiovascular: Normal rate, regular rhythm, normal heart sounds and intact distal pulses.   Occasional extrasystoles are present. PMI is not displaced.  Exam reveals no gallop and no friction rub.   No murmur heard. Pulmonary/Chest: Effort normal and breath sounds normal. No respiratory distress. He has no wheezes. He has no rales.  Abdominal: Soft. Bowel sounds are normal. He exhibits no distension. There is no tenderness. There is no rebound and no guarding.  Musculoskeletal: Normal range of motion. He exhibits no edema.  Neurological: He is alert and oriented to person, place, and time.  Skin: Skin  is warm and dry. No rash noted. No erythema.  Psychiatric: He has a normal mood and affect. His behavior is normal. Judgment and thought content normal.  Nursing note and vitals reviewed.    Adult ECG Report none  Other studies Reviewed: Additional studies/ records that were reviewed today include:  Recent Labs:    . Lab Results  Component Value Date   CHOL 164 06/11/2017   HDL 29 (L) 06/11/2017   LDLCALC 109 (H) 06/11/2017   TRIG 129 06/11/2017   CHOLHDL 5.7 06/11/2017   08/12/2017 - TC 89, TG 71, HDL 35, LDL 40  ; A1c 7.2  ASSESSMENT / PLAN: Problem List Items Addressed This Visit    CAD S/P percutaneous coronary angioplasty - Primary (Chronic)    PCI to an LAD lesion he is on Brilinta and aspirin. I suspect some of his breathing issues are related to Brilinta. Since then not overwhelmingly bothersome to him, I think are okay continuing Brilinta.  He is on atorvastatin and  low-dose beta blocker.      Relevant Medications   metoprolol tartrate (LOPRESSOR) 25 MG tablet   atorvastatin (LIPITOR) 80 MG tablet   Diabetes mellitus without complication (HCC)    New Dx - now on metoformin & Actos (watch for CHF side effect) - may benefit from [facility].      Relevant Medications   atorvastatin (LIPITOR) 80 MG tablet   Fatigue due to treatment    Weaning back down metoprolol & gradually build back. Able to reduce statin dose.      Hyperlipidemia with target low density lipoprotein (LDL) cholesterol less than 70 mg/dL (Chronic)    Reduce atorvastatin to 40 mg based upon recent labs - LDL 40 - AT GOAL. He will be due for labs to be checked to see him back in January.       Relevant Medications   metoprolol tartrate (LOPRESSOR) 25 MG tablet   atorvastatin (LIPITOR) 80 MG tablet   Hypertension (Chronic)    Not really in his shoe for him. He is on low-dose beta blocker, but is noting significant fatigue. Would like to try to do is back down to 12 mg twice a day for couple weeks and then gradually build back up. If he is unable to tolerate this, we can consider using a different beta blocker such as carvedilol or even Bystolic.      Relevant Medications   metoprolol tartrate (LOPRESSOR) 25 MG tablet   atorvastatin (LIPITOR) 80 MG tablet      Current medicines are reviewed at length with the patient today. (+/- concerns) fatigue.- reaction to metoprolol The following changes have been made: none  Patient Instructions  Medication instructions  Decrease Atorvastatin to 40 mg daily - take at bedtime   decrease metoprolol tartrate to 12.5 mg ( 1/2 tablet of 25 mg) twice a day for 2 weeks then  Change to 12.5 mg in the morning and 25 mg in the evening. If can not tolerate decrease back to 12.5 mg twice a day     Your physician wants you to follow-up in Jan 2019 with Dr Herbie BaltimoreHARDING. You will receive a reminder letter in the mail two months in advance. If you  don't receive a letter, please call our office to schedule the follow-up appointment.    If you need a refill on your cardiac medications before your next appointment, please call your pharmacy.    Studies Ordered:   No orders of the defined types were  placed in this encounter.     Glenetta Hew, M.D., M.S. Interventional Cardiologist   Pager # [phone] Phone # [phone] [address] [place], [place] [address]

## 2017-08-23 ENCOUNTER — Encounter: Payer: Self-pay | Admitting: Cardiology

## 2017-08-23 NOTE — Assessment & Plan Note (Signed)
PCI to an LAD lesion he is on Brilinta and aspirin. I suspect some of his breathing issues are related to Brilinta. Since then not overwhelmingly bothersome to him, I think are okay continuing Brilinta.  He is on atorvastatin and low-dose beta blocker.

## 2017-08-23 NOTE — Assessment & Plan Note (Signed)
Not really in his shoe for him. He is on low-dose beta blocker, but is noting significant fatigue. Would like to try to do is back down to 12 mg twice a day for couple weeks and then gradually build back up. If he is unable to tolerate this, we can consider using a different beta blocker such as carvedilol or even Bystolic.

## 2017-08-23 NOTE — Assessment & Plan Note (Signed)
New Dx - now on metoformin & Actos (watch for CHF side effect) - may benefit from [place].

## 2017-08-23 NOTE — Assessment & Plan Note (Addendum)
Reduce atorvastatin to 40 mg based upon recent labs - LDL 40 - AT GOAL. He will be due for labs to be checked to see him back in January.

## 2017-08-23 NOTE — Assessment & Plan Note (Addendum)
Weaning back down metoprolol & gradually build back. Able to reduce statin dose.

## 2018-01-27 ENCOUNTER — Encounter: Payer: Self-pay | Admitting: Cardiology

## 2018-01-27 ENCOUNTER — Ambulatory Visit: Payer: BLUE CROSS/BLUE SHIELD | Admitting: Cardiology

## 2018-01-27 DIAGNOSIS — E785 Hyperlipidemia, unspecified: Secondary | ICD-10-CM | POA: Diagnosis not present

## 2018-01-27 DIAGNOSIS — I214 Non-ST elevation (NSTEMI) myocardial infarction: Secondary | ICD-10-CM | POA: Diagnosis not present

## 2018-01-27 DIAGNOSIS — Z9861 Coronary angioplasty status: Secondary | ICD-10-CM

## 2018-01-27 DIAGNOSIS — R5383 Other fatigue: Secondary | ICD-10-CM | POA: Diagnosis not present

## 2018-01-27 DIAGNOSIS — I5021 Acute systolic (congestive) heart failure: Secondary | ICD-10-CM | POA: Diagnosis not present

## 2018-01-27 DIAGNOSIS — I2510 Atherosclerotic heart disease of native coronary artery without angina pectoris: Secondary | ICD-10-CM | POA: Diagnosis not present

## 2018-01-27 NOTE — Patient Instructions (Addendum)
NO CHANGES WITH MEDICATIONS   Your physician wants you to follow-[facility] in July 2019 [facility].You will receive a reminder letter in the mail two months in advance. If you don't receive a letter, please call our office to schedule the follow-up appointment.   If you need a refill on your cardiac medications before your next appointment, please call your pharmacy.

## 2018-01-27 NOTE — Progress Notes (Signed)
PCP: Brett Collier, Nathan, PA-C  Clinic Note: Chief Complaint  Patient presents with  . Follow-up    4 months;  . Coronary Artery Disease    Non-STEMI in July 2018, PCI LAD    HPI: Brett DuhamelRodney Collier is a 52 y.o. male with a PMH below who presents today for 4-5 month f/u  CAD- PCI in setting of NSTEMI . He had a history of non-STEMI back on July 4. EF 45-50% with anteroseptal and anteroapical hypokinesis. He had a LAD lesion treated with DES stent. He was noted to be diabetic in his hospitalization.Brett Collier.  Brett Collier was last seen on Sept 13, 2018 -- ostly noting "feeling tired" with less energy & libido".  He always feels tired & sleepy -- when he gets home from work, he "just wants to sleep."  He was told by a family member (who works in medicine) that this was because of his Metoprolol.  Interestingly, he still notes intermittent episodes where his heart rate goes up for no reason (i.e. No stress or exercise). He says that has random occurrences of breathing "funny".     Recent Hospitalizations:   06/10/2017: Admitted with non-STEMI - > PCI to LAD. Also diagnosed with diabetes  Studies Personally Reviewed - (if available, images/films reviewed: From Epic Chart or Care Everywhere)   2 D Echo (in stetting of LAD NSTEMI) June 11, 2017: EF 45-50%. Hypokinesis of mid-anteroseptal & anterior/antero-apical wall.  Interval History: Brett Collier returns today overall doing fairly well.  He says that overall his energy level is improved and he pretty much does okay without any cardiac symptoms with the exception of having chest discomfort during intercourse.  He also has noted having significant erectile dysfunction requiring him to use Viagra.  Usually what happens is he does the Viagra and then will notice having discomfort in his chest during intercourse.  He does not have it during routine activities such as his routine exercise or working. He otherwise denies any rapid irregular heartbeats or  palpitations.  He still has the "" funny feeling episodes in his chest ", and has occasional episodes of "gasping for breath ", but otherwise is doing relatively well without any major symptoms.  No PND, orthopnea or edema.  No resting or exertional dyspnea and only the chest pain noted above.  No rapid irregular heartbeats palpitations.  No syncope/near syncope or TIA/amaurosis fugax.  No claudication.  ROS: A comprehensive was performed. Review of Systems  Constitutional: Positive for malaise/fatigue.  HENT: Negative for congestion.   Respiratory: Positive for shortness of breath.   Cardiovascular: Negative for palpitations.  Gastrointestinal: Negative for abdominal pain and heartburn.  Musculoskeletal: Negative for joint pain.  Neurological: Positive for dizziness (standing up quickly). Negative for weakness.  Endo/Heme/Allergies: Does not bruise/bleed easily.  Psychiatric/Behavioral: Negative for depression and memory loss. The patient is not nervous/anxious.   All other systems reviewed and are negative.   I have reviewed and (if needed) personally updated the patient's problem list, medications, allergies, past medical and surgical history, social and family history.   Past Medical History:  Diagnosis Date  . CAD (coronary artery disease), native coronary artery    06/12/17 PCI with STENT SYNERGY DES 2.5X20 to mLAD.   . Diabetes (HCC)   . Hyperlipemia     Past Surgical History:  Procedure Laterality Date  . CORONARY STENT INTERVENTION N/A 06/12/2017   Procedure: Coronary Stent Intervention;  Surgeon: Marykay LexHarding, David W, MD;  [facility]: PCI to mLAD  99%-0%: STENT SYNERGY DES 2.5X20 (2.75 mm)  . LEFT HEART CATH AND CORONARY ANGIOGRAPHY N/A 06/12/2017   Procedure: Left Heart Cath and Coronary Angiography;  Surgeon: Marykay Lex, MD;  Location: [facility] INVASIVE CV LAB: mLAd 99%, diffuse mild disease in RCA. EF ~45% with antero-apical hypokinesis & mild LVEDP elevation.  .  TRANSTHORACIC ECHOCARDIOGRAM  06/11/2017   In setting of delayed presentation MI/non-STEMI (LAD infarct with PCI to LAD):  EF 45-50%. Hypokinesis of mid-anteroseptal & anterior/antero-apical wall.    Heart Catheterization and Coronary Angiography/Coronary Stent Intervention June 12, 2017 -  Culprit Lesion: Mid LAD lesion, 99 %stenosed.  A STENT SYNERGY DES 2.5X20 drug eluting stent was successfully placed. Post-dilated to 2.75 mm  Severe single vessel disease with mid LAD stenosis treated with a single DES stent. Mildly reduced EF consistent with anterior MI.  Wall Motion      Diagnostic Diagram     Post-Intervention Diagram        Current Meds  Medication Sig  . aspirin EC 81 MG EC tablet Take 1 tablet (81 mg total) by mouth daily.  Marland Kitchen atorvastatin (LIPITOR) 80 MG tablet Take 1 tablet (80 mg total) by mouth daily at 6 PM.  . metFORMIN (GLUCOPHAGE-XR) 500 MG 24 hr tablet Take 1,000 mg by mouth 2 (two) times daily.  . metoprolol tartrate (LOPRESSOR) 25 MG tablet Take as directed.  . nitroGLYCERIN (NITROSTAT) 0.4 MG SL tablet Place 1 tablet (0.4 mg total) under the tongue every 5 (five) minutes as needed.  . pioglitazone (ACTOS) 30 MG tablet Take 30 mg by mouth daily.  . ticagrelor (BRILINTA) 90 MG TABS tablet Take 1 tablet (90 mg total) by mouth 2 (two) times daily.    No Known Allergies  Social History   Socioeconomic History  . Marital status: Married    Spouse name: None  . Number of children: None  . Years of education: None  . Highest education level: None  Social Needs  . Financial resource strain: None  . Food insecurity - worry: None  . Food insecurity - inability: None  . Transportation needs - medical: None  . Transportation needs - non-medical: None  Occupational History    Employer: [employer]  Tobacco Use  . Smoking status: Never Smoker  . Smokeless tobacco: Never Used  Substance and Sexual Activity  . Alcohol use: No  . Drug use: No  . Sexual activity:  None  Other Topics Concern  . None  Social History Narrative  . None    family history includes Heart Problems in his mother; Heart attack (age of onset: 36) in his maternal grandfather; High blood pressure in his brother.  Wt Readings from Last 3 Encounters:  01/27/18 187 lb (84.8 kg)  08/21/17 182 lb (82.6 kg)  07/15/17 180 lb (81.6 kg)    PHYSICAL EXAM BP 129/80   Pulse 69   Ht 5' 6.5" (1.689 m)   Wt 187 lb (84.8 kg)   BMI 29.73 kg/m  Physical Exam  Constitutional: He is oriented to person, place, and time. He appears well-developed and well-nourished. No distress.  Healthy appearing. Well groomed.  HENT:  Head: Normocephalic and atraumatic.  Eyes: EOM are normal.  Neck: Normal range of motion. Neck supple. No hepatojugular reflux and no JVD present. Carotid bruit is not present.  Cardiovascular: Normal rate, regular rhythm, normal heart sounds and intact distal pulses.  Occasional extrasystoles are present. PMI is not displaced. Exam reveals no gallop and no friction rub.  No murmur heard. Pulmonary/Chest: Effort normal and breath sounds normal. No respiratory distress. He has no wheezes. He has no rales.  Abdominal: Soft. Bowel sounds are normal. He exhibits no distension. There is no tenderness. There is no rebound and no guarding.  Musculoskeletal: Normal range of motion. He exhibits no edema.  Neurological: He is alert and oriented to person, place, and time.  Skin: Skin is warm and dry. No rash noted. No erythema.  Psychiatric: He has a normal mood and affect. His behavior is normal. Judgment and thought content normal.  Nursing note and vitals reviewed.    Adult ECG Report none  Other studies Reviewed: Additional studies/ records that were reviewed today include:  Recent Labs:    . Lab Results  Component Value Date   CHOL 164 06/11/2017   HDL 29 (L) 06/11/2017   LDLCALC 109 (H) 06/11/2017   TRIG 129 06/11/2017   CHOLHDL 5.7 06/11/2017   08/12/2017 - TC  89, TG 71, HDL 35, LDL 40  ; A1c 7.2 - Due for check in March.   ASSESSMENT / PLAN: Problem List Items Addressed This Visit    Acute systolic heart failure (HCC)    In the setting of anterior MI he has some heart failure symptoms.  Now no longer having heart failure symptoms.      CAD S/P percutaneous coronary angioplasty (Chronic)    Status post PCI to the LAD in the setting of a non-STEMI.  This is probably delayed presentation of an MI with residual anterior wall motion I am not sure what to make of the symptom he is having during intercourse, but otherwise is not having any active anginal symptoms exercise. Plan: Continue Brilinta plus beta-blocker and atorvastatin. I will have him stop aspirin and he can cut the metoprolol down in half because of some fatigue and dizziness episodes.      Fatigue due to treatment    He tried to wean the metoprolol back down to 12-1/2 twice daily and then increase gradually to 25 mg twice daily, but would not able to will maintain 1/2 tablet twice daily. Also feels better having reduced statin dose.      Hyperlipidemia with target low density lipoprotein (LDL) cholesterol less than 70 mg/dL (Chronic)    Was reduced to 40 mg of atorvastatin last visit last labs look great in September.  Due for follow-up check in March.  Continue to watch diet.  Continue exercise.      NSTEMI (non-ST elevated myocardial infarction) (HCC)    Significant/subtotal occlusion of the LAD in the setting with mildly reduced EF on echo and LV Gram.  No active heart failure symptoms. Was diagnosed with diabetes at that time is now on medical management.  He is on moderate dose statin as well as aspirin and Brilinta.  Okay to stop aspirin at this time.  Continue low-dose beta-blocker.  No real ability to titrate beta-blocker further due to fatigue symptoms.         Current medicines are reviewed at length with the patient today. (+/- concerns) fatigue.- reaction to  metoprolol The following changes have been made: none  Patient Instructions  NO CHANGES WITH MEDICATIONS   Your physician wants you to follow-up in July 2019 WITH DR HARDING.You will receive a reminder letter in the mail two months in advance. If you don't receive a letter, please call our office to schedule the follow-up appointment.   If you need a refill on your cardiac medications before  your next appointment, please call your pharmacy.    Studies Ordered:   No orders of the defined types were placed in this encounter.     Bryan Lemma, M.D., M.S. Interventional Cardiologist   Pager # [phone] Phone # [phone] [address] [place], [place] [address]

## 2018-01-29 ENCOUNTER — Encounter: Payer: Self-pay | Admitting: Cardiology

## 2018-01-29 NOTE — Assessment & Plan Note (Signed)
Was reduced to 40 mg of atorvastatin last visit last labs look great in September.  Due for follow-up check in March.  Continue to watch diet.  Continue exercise.

## 2018-01-29 NOTE — Assessment & Plan Note (Signed)
Status post PCI to the LAD in the setting of a non-STEMI.  This is probably delayed presentation of an MI with residual anterior wall motion I am not sure what to make of the symptom he is having during intercourse, but otherwise is not having any active anginal symptoms exercise. Plan: Continue Brilinta plus beta-blocker and atorvastatin. I will have him stop aspirin and he can cut the metoprolol down in half because of some fatigue and dizziness episodes.

## 2018-01-29 NOTE — Assessment & Plan Note (Signed)
Significant/subtotal occlusion of the LAD in the setting with mildly reduced EF on echo and LV Gram.  No active heart failure symptoms. Was diagnosed with diabetes at that time is now on medical management.  He is on moderate dose statin as well as aspirin and Brilinta.  Okay to stop aspirin at this time.  Continue low-dose beta-blocker.  No real ability to titrate beta-blocker further due to fatigue symptoms.

## 2018-01-29 NOTE — Assessment & Plan Note (Signed)
He tried to wean the metoprolol back down to 12-1/2 twice daily and then increase gradually to 25 mg twice daily, but would not able to will maintain 1/2 tablet twice daily. Also feels better having reduced statin dose.

## 2018-01-29 NOTE — Assessment & Plan Note (Signed)
In the setting of anterior MI he has some heart failure symptoms.  Now no longer having heart failure symptoms.

## 2018-02-18 ENCOUNTER — Encounter: Payer: Self-pay | Admitting: Cardiology

## 2018-02-18 DIAGNOSIS — Z1331 Encounter for screening for depression: Secondary | ICD-10-CM | POA: Diagnosis not present

## 2018-02-18 DIAGNOSIS — M7742 Metatarsalgia, left foot: Secondary | ICD-10-CM | POA: Diagnosis not present

## 2018-02-18 DIAGNOSIS — I2510 Atherosclerotic heart disease of native coronary artery without angina pectoris: Secondary | ICD-10-CM | POA: Diagnosis not present

## 2018-02-18 DIAGNOSIS — E119 Type 2 diabetes mellitus without complications: Secondary | ICD-10-CM | POA: Diagnosis not present

## 2018-02-18 DIAGNOSIS — J019 Acute sinusitis, unspecified: Secondary | ICD-10-CM | POA: Diagnosis not present

## 2018-06-05 DIAGNOSIS — Z23 Encounter for immunization: Secondary | ICD-10-CM | POA: Diagnosis not present

## 2018-06-05 DIAGNOSIS — Z0000 Encounter for general adult medical examination without abnormal findings: Secondary | ICD-10-CM | POA: Diagnosis not present

## 2018-06-05 DIAGNOSIS — E119 Type 2 diabetes mellitus without complications: Secondary | ICD-10-CM | POA: Diagnosis not present

## 2018-06-05 DIAGNOSIS — I2510 Atherosclerotic heart disease of native coronary artery without angina pectoris: Secondary | ICD-10-CM | POA: Diagnosis not present

## 2018-07-10 ENCOUNTER — Other Ambulatory Visit: Payer: Self-pay | Admitting: Cardiology

## 2018-07-10 NOTE — Telephone Encounter (Signed)
This is Dr. Harding's pt. °

## 2018-07-14 NOTE — Telephone Encounter (Signed)
Rx request sent to pharmacy.  

## 2018-08-06 ENCOUNTER — Telehealth: Payer: Self-pay

## 2018-08-06 NOTE — Telephone Encounter (Signed)
   [facility] Pre-operative Risk Assessment    Request for surgical clearance:  1. What type of surgery is being performed? Colonoscopy   2. When is this surgery scheduled? TBD   3. What type of clearance is required (medical clearance vs. Pharmacy clearance to hold med vs. Both)? Both  4. Are there any medications that need to be held prior to surgery and how long?ASA, Brilinta   5. Practice name and name of physician performing surgery? [facility]   6. What is your office phone number [phone]    7.   What is your office fax number [phone]  8.   Anesthesia type (None, local, MAC, general) ?  Propofol sedation   Meryl Crutch 08/06/2018, 2:17 PM  _________________________________________________________________   (provider comments below)

## 2018-08-06 NOTE — Telephone Encounter (Signed)
   Primary Cardiologist: Dr. Herbie BaltimoreHarding, 01/29/2018  Chart reviewed as part of pre-operative protocol coverage. Because of [name]'s past medical history and time since last visit, he/she will require a follow-up visit in order to better assess preoperative cardiovascular risk.  Pre-op covering staff: - Please schedule appointment and call patient to inform them. - Please contact requesting surgeon's office via preferred method (i.e, phone, fax) to inform them of need for appointment prior to surgery.  [name], PA-C  08/06/2018, 5:30 PM

## 2018-08-07 NOTE — Telephone Encounter (Signed)
Call back staff:  Marland Kitchen. Please see notes.  Patient needs an appointment for surgical clearance. . Please arrange an appointment with Bryan Lemmaavid Harding, MD or Care Team.  Date of planned procedure is:  TBD. Marland Kitchen. Please route note to the provider that will see the patient as he/she will make final recommendations regarding surgical risk and route those recommendations to the requesting surgeon. . This note will be removed from CV DIV Preop.  Tereso NewcomerScott Nyoka Alcoser, PA-C    08/07/2018 11:08 AM

## 2018-08-07 NOTE — Telephone Encounter (Signed)
Per Pre Op Protocol pt needs appt. I left message to call back and schedule appt for surgery clearance. This is duplicated in the pre op pool call back pool. I will remove one so that there is just one message in the pre op call back pool.

## 2018-08-12 NOTE — Telephone Encounter (Signed)
Tried to reach pt re: appt for surgical clearance. Pt doesn't have a voicemail set up. Pt already scheduled to see Dr. Herbie Baltimore 09/07/18 and can be addressed at that time. Will close encounter and get out of preop pool.

## 2018-09-07 ENCOUNTER — Encounter: Payer: Self-pay | Admitting: Cardiology

## 2018-09-07 ENCOUNTER — Ambulatory Visit: Payer: BLUE CROSS/BLUE SHIELD | Admitting: Cardiology

## 2018-09-07 VITALS — BP 127/82 | HR 64 | Ht 66.5 in | Wt 177.4 lb

## 2018-09-07 DIAGNOSIS — I10 Essential (primary) hypertension: Secondary | ICD-10-CM

## 2018-09-07 DIAGNOSIS — I255 Ischemic cardiomyopathy: Secondary | ICD-10-CM | POA: Diagnosis not present

## 2018-09-07 DIAGNOSIS — I2510 Atherosclerotic heart disease of native coronary artery without angina pectoris: Secondary | ICD-10-CM

## 2018-09-07 DIAGNOSIS — E785 Hyperlipidemia, unspecified: Secondary | ICD-10-CM

## 2018-09-07 DIAGNOSIS — Z9861 Coronary angioplasty status: Secondary | ICD-10-CM | POA: Diagnosis not present

## 2018-09-07 DIAGNOSIS — R5383 Other fatigue: Secondary | ICD-10-CM

## 2018-09-07 MED ORDER — CLOPIDOGREL BISULFATE 75 MG PO TABS: 75.0000 mg | ORAL_TABLET | Freq: Every day | ORAL | 3 refills | Status: DC

## 2018-09-07 NOTE — Progress Notes (Signed)
PCP: Lonie Peak, PA-C  Clinic Note: Chief Complaint  Patient presents with  . Follow-up    No recurrent angina  . Coronary Artery Disease    S/P LAD PCI for non-STEMI.  No recurrent angina    HPI: Brett Collier is a 52 y.o. male with a PMH notable for  CAD- PCI in setting of NonSTEMI with mild ischemic cardiomyopathy who presents today for 8  month f/u  .  PMH notable for non-STEMI back on July 4. EF 45-50% with anteroseptal and anteroapical hypokinesis. He had a LAD lesion treated with DES stent. He was noted to be diabetic in his hospitalization.Brett Collier was last seen on January 27, 2018: Doing well.  Energy level improved.  No cardiac complaints resection of occasional chest discomfort during intercourse.  Had issues with erectile dysfunction.  Occasional funny twinges/palpitations in his chest.  No longer having little to get dyspnea spells.  Recent Hospitalizations:   n/a  Studies Personally Reviewed - (if available, images/films reviewed: From Epic Chart or Care Everywhere)  No new  Interval History: Brett Collier returns today delayed 75-month follow-up for his CAD with mild cardiomyopathy.  He seems to be doing very well with no recurrent chest pain or pressure episodes with rest or exertion.  Little fleeting episodes of notably improved as well.  No real palpitations.  He does not comment now about chest pain all the time with intercourse, but only on occasion with either very vigorous exertion or during intercourse.  He denies any heart failure symptoms of PND, orthopnea or edema.  No rapid irregular heartbeats palpitations.  No syncope/near syncope or TIA/amaurosis fugax.  Unfortunately, after his 1 year of Brilinta, he did not have a refill of prescription, and the possibility of miscommunication between the pharmacy in the office (I do not see any evidence of a phone call).  He therefore never had a follow-up prescription.  He says he has been off it for over a  month now.  ROS: A comprehensive was performed. Review of Systems  Constitutional: Negative for malaise/fatigue (Notably improved).  HENT: Negative for congestion and nosebleeds.   Respiratory: Negative for cough, hemoptysis and shortness of breath.   Cardiovascular: Negative for chest pain and palpitations.  Gastrointestinal: Negative for abdominal pain, blood in stool, heartburn and melena.  Musculoskeletal: Negative for joint pain.  Neurological: Positive for dizziness (standing up quickly). Negative for weakness.  Endo/Heme/Allergies: Does not bruise/bleed easily.  Psychiatric/Behavioral: Negative for depression and memory loss. The patient is not nervous/anxious.   All other systems reviewed and are negative.   I have reviewed and (if needed) personally updated the patient's problem list, medications, allergies, past medical and surgical history, social and family history.   Past Medical History:  Diagnosis Date  . CAD (coronary artery disease), native coronary artery    06/12/17 PCI with STENT SYNERGY DES 2.5X20 to mLAD.   . Diabetes (HCC)   . Hyperlipemia     Past Surgical History:  Procedure Laterality Date  . CORONARY STENT INTERVENTION N/A 06/12/2017   Procedure: Coronary Stent Intervention;  Surgeon: Marykay Lex, MD;  Location: [facility] INVASIVE CV LAB: PCI to mLAD 99%-0%: STENT SYNERGY DES 2.5X20 (2.75 mm)  . LEFT HEART CATH AND CORONARY ANGIOGRAPHY N/A 06/12/2017   Procedure: Left Heart Cath and Coronary Angiography;  Surgeon: Marykay Lex, MD;  Location: [facility] INVASIVE CV LAB: mLAd 99%, diffuse mild disease in RCA. EF ~45% with antero-apical hypokinesis & mild LVEDP elevation.  Marland Kitchen  TRANSTHORACIC ECHOCARDIOGRAM  06/11/2017   In setting of delayed presentation MI/non-STEMI (LAD infarct with PCI to LAD):  EF 45-50%. Hypokinesis of mid-anteroseptal & anterior/antero-apical wall.    Heart Catheterization and Coronary Angiography/Coronary Stent Intervention June 12, 2017 -     Culprit Lesion: Mid LAD lesion, 99 %stenosed.  A STENT SYNERGY DES 2.5X20 drug eluting stent was successfully placed. Post-dilated to 2.75 mm  Severe single vessel disease with mid LAD stenosis treated with a single DES stent. Mildly reduced EF consistent with anterior MI.  Wall Motion           Diagnostic Diagram    Post-Intervention Diagram        Current Meds  Medication Sig  . atorvastatin (LIPITOR) 80 MG tablet Take 1 tablet (80 mg total) by mouth daily at 6 PM.  . metFORMIN (GLUCOPHAGE-XR) 500 MG 24 hr tablet Take 1,000 mg by mouth 2 (two) times daily.  . metoprolol tartrate (LOPRESSOR) 25 MG tablet Take as directed.  . nitroGLYCERIN (NITROSTAT) 0.4 MG SL tablet Place 1 tablet (0.4 mg total) under the tongue every 5 (five) minutes as needed.  . pioglitazone (ACTOS) 30 MG tablet Take 30 mg by mouth daily.  . [DISCONTINUED] aspirin EC 81 MG EC tablet Take 1 tablet (81 mg total) by mouth daily.  . [DISCONTINUED] BRILINTA 90 MG TABS tablet TAKE 1 TABLET BY MOUTH TWICE DAILY    No Known Allergies  Social History   Tobacco Use  . Smoking status: Never Smoker  . Smokeless tobacco: Never Used  Substance Use Topics  . Alcohol use: No  . Drug use: No   Social History   Social History Narrative  . Not on file     family history includes Heart Problems in his mother; Heart attack (age of onset: 64) in his maternal grandfather; High blood pressure in his brother.  Wt Readings from Last 3 Encounters:  09/07/18 177 lb 6.4 oz (80.5 kg)  01/27/18 187 lb (84.8 kg)  08/21/17 182 lb (82.6 kg)    PHYSICAL EXAM BP 127/82   Pulse 64   Ht 5' 6.5" (1.689 m)   Wt 177 lb 6.4 oz (80.5 kg)   BMI 28.20 kg/m  Physical Exam  Constitutional: He is oriented to person, place, and time. He appears well-developed and well-nourished. No distress.  Healthy appearing. Well groomed.  HENT:  Head: Normocephalic and atraumatic.  Eyes: EOM are normal.  Neck: Normal range of motion. Neck supple.  No hepatojugular reflux and no JVD present. Carotid bruit is not present.  Cardiovascular: Normal rate, regular rhythm, normal heart sounds and intact distal pulses.  Occasional extrasystoles are present. PMI is not displaced. Exam reveals no gallop and no friction rub.  No murmur heard. Pulmonary/Chest: Effort normal and breath sounds normal. No respiratory distress. He has no wheezes. He has no rales.  Abdominal: Soft. Bowel sounds are normal. He exhibits no distension. There is no tenderness. There is no rebound and no guarding.  Musculoskeletal: Normal range of motion. He exhibits no edema.  Neurological: He is alert and oriented to person, place, and time.  Skin: Skin is warm and dry. No rash noted. No erythema.  Psychiatric: He has a normal mood and affect. His behavior is normal. Judgment and thought content normal.  Nursing note and vitals reviewed.    Adult ECG Report Sinus rhythm, rate 64 bpm.  Septal MI, age undetermined noted.  Anterior T wave inversions no longer present.  Other studies Reviewed: Additional studies/  records that were reviewed today include:  Recent Labs (from [facility] in 06/05/2018):   TC 86, TG 47, HDL 33, LDL 40.  A1c 6.8.  Hemoglobin 13.9.  Creatinine 1.0.  Potassium 4.5. .  ASSESSMENT / PLAN: Problem List Items Addressed This Visit    CAD S/P percutaneous coronary angioplasty - Primary (Chronic)    Status post LAD PCI.  He has not really been on Brilinta even though it is listed.  I asked him to switch him to clopidogrel (loading dose 4 tabs day 1, then 75 mg daily.   Would like to continue for at least 2 years post PCI with Plavix.-- >  Okay to hold Plavix for procedures (5-7 days depending on procedure.) On beta-blocker and high-dose statin.      Relevant Orders   EKG 12-Lead   Coronary artery disease involving native coronary artery of native heart without angina pectoris (Chronic)    On statin and beta-blocker.  Converting from Brilinta to Plavix.  No  recurrent angina symptoms.      Relevant Orders   EKG 12-Lead   Fatigue due to treatment    Seems to be doing relatively well on current dose of beta-blocker now.  Less fatigue      Hyperlipidemia with target low density lipoprotein (LDL) cholesterol less than 70 mg/dL (Chronic)    I think he is taking half tablet of the 80 mg atorvastatin.  Last lipids look very well controlled.  For now would continue current dose and monitor. Continue to watch diet and increase exercise.      Hypertension (Chronic)    Fairly well controlled on low-dose blocker.  Hopefully we will convert to Toprol at next visit.      Ischemic cardiomyopathy (Chronic)    Mildly reduced EF by echo after anterior non-STEMI.  We never did recheck an echocardiogram.  Would expect it to be somewhat improved. No real heart failure symptoms with stable dose of Lopressor.  Would prefer to be long-acting, but I would like for him to simply be on it at this point.  Relatively euvolemic.  As such, we will hold off on ACE inhibitor or ARB for afterload reduction.         Current medicines are reviewed at length with the patient today. (+/- concerns) fatigue.- reaction to metoprolol The following changes have been made: none  Patient Instructions  Medication instruction   Start Clopidogrel 75 mg one tablet daily  Stop brilinta     CLEARANCE FOR COLONOSCOPY - MAY HOLD PLAVIX 5 TO 7 DAYS PRIOR TO PROCEDURE   OR  COLONOSCOPY THEN RESTART  THEREAFTER.   NO OTHER CHANGES NOTED.     Your physician wants you to follow-up in June of July  of 2020 WITH DR [facility]. You will receive a reminder letter in the mail two months in advance. If you don't receive a letter, please call our office to schedule the follow-up appointment.    If you need a refill on your cardiac medications before your next appointment, please call your pharmacy.    Studies Ordered:   Orders Placed This Encounter  Procedures  . EKG 12-Lead        Bryan Lemma, M.D., M.S. Interventional Cardiologist   Pager # [phone] Phone # [phone] [address] [place], [place] [address]

## 2018-09-07 NOTE — Patient Instructions (Signed)
Medication instruction   Start Clopidogrel 75 mg one tablet daily  Stop brilinta     CLEARANCE FOR COLONOSCOPY - MAY HOLD PLAVIX 5 TO 7 DAYS PRIOR TO PROCEDURE   OR  COLONOSCOPY THEN RESTART  THEREAFTER.   NO OTHER CHANGES NOTED.     Your physician wants you to follow-up in June of July  of 2020 WITH DR [facility]. You will receive a reminder letter in the mail two months in advance. If you don't receive a letter, please call our office to schedule the follow-up appointment.    If you need a refill on your cardiac medications before your next appointment, please call your pharmacy.

## 2018-09-08 ENCOUNTER — Encounter: Payer: Self-pay | Admitting: Cardiology

## 2018-09-08 DIAGNOSIS — I2510 Atherosclerotic heart disease of native coronary artery without angina pectoris: Secondary | ICD-10-CM | POA: Insufficient documentation

## 2018-09-08 NOTE — Assessment & Plan Note (Signed)
Fairly well controlled on low-dose blocker.  Hopefully we will convert to Toprol at next visit.

## 2018-09-08 NOTE — Assessment & Plan Note (Addendum)
Mildly reduced EF by echo after anterior non-STEMI.  We never did recheck an echocardiogram.  Would expect it to be somewhat improved. No real heart failure symptoms with stable dose of Lopressor.  Would prefer to be long-acting, but I would like for him to simply be on it at this point.  Relatively euvolemic.  As such, we will hold off on ACE inhibitor or ARB for afterload reduction.

## 2018-09-08 NOTE — Assessment & Plan Note (Signed)
On statin and beta-blocker.  Converting from Brilinta to Plavix.  No recurrent angina symptoms.

## 2018-09-08 NOTE — Assessment & Plan Note (Addendum)
Status post LAD PCI.  He has not really been on Brilinta even though it is listed.  I asked him to switch him to clopidogrel (loading dose 4 tabs day 1, then 75 mg daily.   Would like to continue for at least 2 years post PCI with Plavix.-- >  Okay to hold Plavix for procedures (5-7 days depending on procedure.) On beta-blocker and high-dose statin.

## 2018-09-08 NOTE — Assessment & Plan Note (Signed)
I think he is taking half tablet of the 80 mg atorvastatin.  Last lipids look very well controlled.  For now would continue current dose and monitor. Continue to watch diet and increase exercise.

## 2018-09-08 NOTE — Assessment & Plan Note (Signed)
Seems to be doing relatively well on current dose of beta-blocker now.  Less fatigue

## 2018-09-18 IMAGING — CR DG CHEST 2V
2 series · 2 of 2 positions shown · non-contrast
Comparison: None.

CLINICAL DATA: Acute onset of mid chest pain.  Initial encounter.

EXAM:
CHEST  2 VIEW

[chest pa]
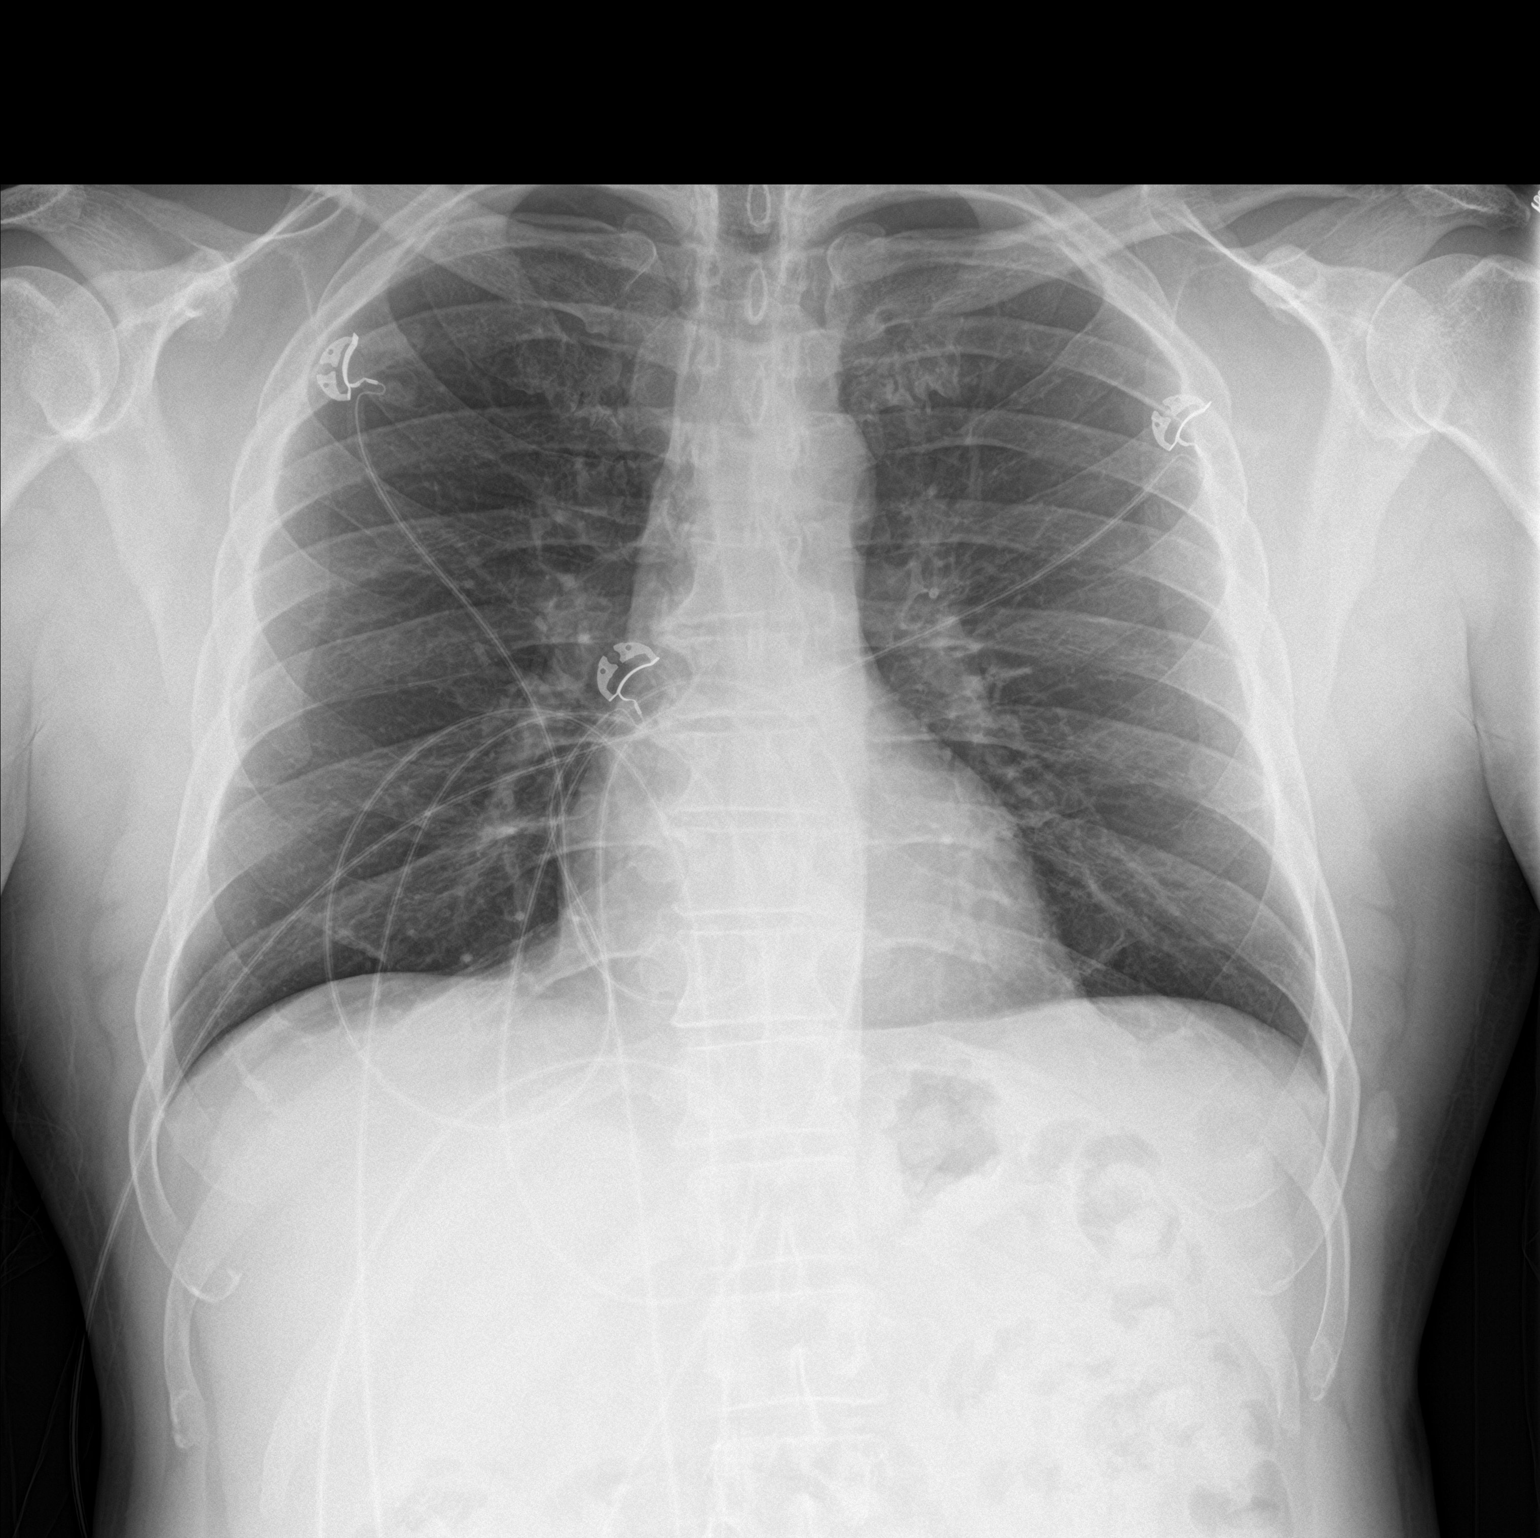

[chest lat]
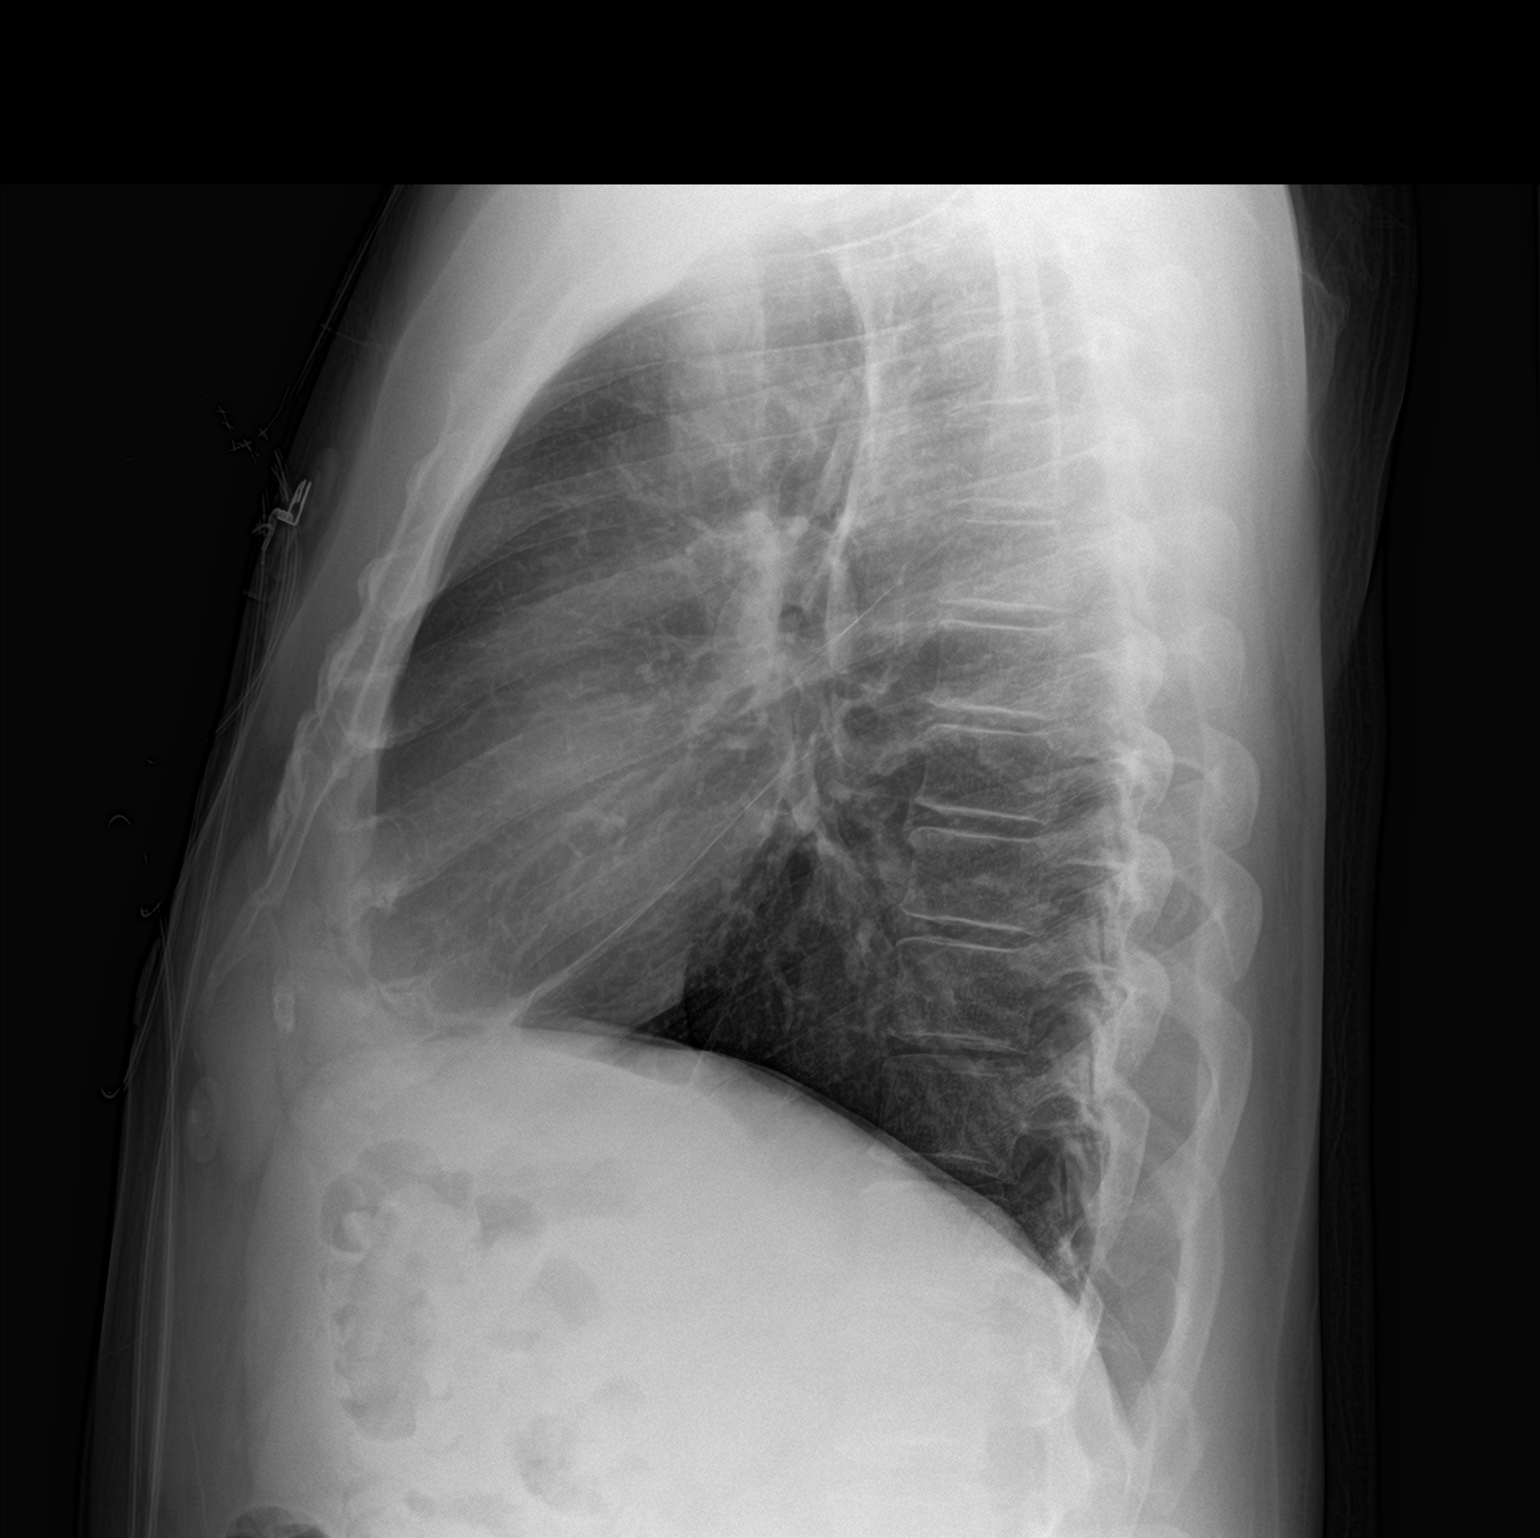

[2 of 2 positions shown; findings below may reference images not displayed]

FINDINGS: The lungs are well-aerated and clear. There is no evidence of focal
opacification, pleural effusion or pneumothorax.

The heart is normal in size; the mediastinal contour is within
normal limits. No acute osseous abnormalities are seen.
IMPRESSION: No acute cardiopulmonary process seen.

## 2018-09-19 ENCOUNTER — Other Ambulatory Visit: Payer: Self-pay | Admitting: Cardiology

## 2018-12-14 ENCOUNTER — Other Ambulatory Visit: Payer: Self-pay | Admitting: Cardiology

## 2018-12-15 NOTE — Telephone Encounter (Signed)
Rx request sent to pharmacy.  

## 2018-12-23 ENCOUNTER — Other Ambulatory Visit: Payer: Self-pay | Admitting: Cardiology

## 2018-12-23 NOTE — Telephone Encounter (Signed)
Rx request sent to pharmacy.  

## 2019-01-15 DIAGNOSIS — E119 Type 2 diabetes mellitus without complications: Secondary | ICD-10-CM | POA: Diagnosis not present

## 2019-01-15 DIAGNOSIS — M5090 Cervical disc disorder, unspecified, unspecified cervical region: Secondary | ICD-10-CM | POA: Diagnosis not present

## 2019-01-15 DIAGNOSIS — Z125 Encounter for screening for malignant neoplasm of prostate: Secondary | ICD-10-CM | POA: Diagnosis not present

## 2019-01-15 DIAGNOSIS — I2510 Atherosclerotic heart disease of native coronary artery without angina pectoris: Secondary | ICD-10-CM | POA: Diagnosis not present

## 2019-03-26 ENCOUNTER — Other Ambulatory Visit: Payer: Self-pay | Admitting: Cardiology

## 2019-03-26 NOTE — Telephone Encounter (Signed)
Lopressor 25 mg refilled 

## 2019-06-23 ENCOUNTER — Telehealth: Payer: Self-pay | Admitting: Cardiology

## 2019-06-23 NOTE — Telephone Encounter (Signed)
LVM, reminding pt of his apt with Dr Ellyn Hack on 06-24-19.

## 2019-06-24 ENCOUNTER — Other Ambulatory Visit: Payer: Self-pay

## 2019-06-24 ENCOUNTER — Ambulatory Visit: Payer: BC Managed Care – PPO | Admitting: Cardiology

## 2019-06-24 ENCOUNTER — Encounter: Payer: Self-pay | Admitting: Cardiology

## 2019-06-24 VITALS — BP 131/82 | HR 65 | Temp 97.0°F | Ht 66.5 in | Wt 178.6 lb

## 2019-06-24 DIAGNOSIS — I2510 Atherosclerotic heart disease of native coronary artery without angina pectoris: Secondary | ICD-10-CM | POA: Diagnosis not present

## 2019-06-24 DIAGNOSIS — I255 Ischemic cardiomyopathy: Secondary | ICD-10-CM | POA: Diagnosis not present

## 2019-06-24 DIAGNOSIS — E785 Hyperlipidemia, unspecified: Secondary | ICD-10-CM | POA: Diagnosis not present

## 2019-06-24 DIAGNOSIS — I10 Essential (primary) hypertension: Secondary | ICD-10-CM | POA: Diagnosis not present

## 2019-06-24 DIAGNOSIS — E118 Type 2 diabetes mellitus with unspecified complications: Secondary | ICD-10-CM | POA: Diagnosis not present

## 2019-06-24 DIAGNOSIS — Z9861 Coronary angioplasty status: Secondary | ICD-10-CM

## 2019-06-24 MED ORDER — METOPROLOL TARTRATE 25 MG PO TABS: 12.5000 mg | ORAL_TABLET | Freq: Two times a day (BID) | ORAL | 3 refills | Status: DC

## 2019-06-24 MED ORDER — ATORVASTATIN CALCIUM 40 MG PO TABS: 40.0000 mg | ORAL_TABLET | Freq: Every day | ORAL | 3 refills | Status: DC

## 2019-06-24 NOTE — Patient Instructions (Addendum)
Medication Instructions:   start taking Metoprolol tartrate  1/2 tablet of 25 mg twice a day. Decrease Atorvastatin 40 mg  one tablet daily.    If you need a refill on your cardiac medications before your next appointment, please call your pharmacy.   Lab work:  Not needed  Testing/Procedures: Not needed  Follow-Up: At [facility], you and your health needs are our priority.  As part of our continuing mission to provide you with exceptional heart care, we have created designated Provider Care Teams.  These Care Teams include your primary Cardiologist (physician) and Advanced Practice Providers (APPs -  Physician Assistants and Nurse Practitioners) who all work together to provide you with the care you need, when you need it. . You will need a follow up appointment in 12 months- July 16,2021.  Please call our office 2 months in advance to schedule this appointment.  You may see Glenetta Hew, MD or one of the following Advanced Practice Providers on your designated Care Team:   . Rosaria Ferries, PA-C . Jory Sims, DNP, ANP  Any Other Special Instructions Will Be Listed Below (If Applicable).

## 2019-06-24 NOTE — Progress Notes (Signed)
PCP: Lonie Peakonroy, Nathan, PA-C  Clinic Note: Chief Complaint  Patient presents with  . Follow-up    Doing well  . Coronary Artery Disease    Has questions about stopping medicines    HPI: Brett Collier is a 53 y.o. male with a PMH notable for CAD-PCI (non-STEMI with mild ICM) who presents here for annual follow-up (this being since the 2-year anniversary from his MI).  June 11, 2017: Non-STEMI--> EF 45-50% with anteroseptal and anteroapical hypokinesis. He had a LAD lesion treated with DES stent. He was noted to be diabetic in his hospitalization.Brett Duhamel.  Brett Collier was last seen on in Sept 2019 --> converted to Plavix with plans to continue at least 2 years and discuss potentially stopping.  Recent Hospitalizations:   n/a  Studies Personally Reviewed - (if available, images/films reviewed: From Epic Chart or Care Everywhere)  No new  Interval History: Brett Collier returns today intended 8619-month follow-up-- only issue is MSK aches & pains - ? Pulled a shoulder muscle.  He actually has been quite active, doing routine walking, but not necessarily stable exercise.  He walks a lot at work and takes the stairs when possible etc.  He denies any real cardiac symptoms- no angina or heart failure symptoms.  Cardiovascular ROS: no chest pain or dyspnea on exertion negative for - edema, irregular heartbeat, loss of consciousness, orthopnea, palpitations, paroxysmal nocturnal dyspnea, rapid heart rate, shortness of breath or TIA shows amaurosis fugax. Dizziness/wooziness with near syncope.  Claudication  Notably less bruising with Plavix and being off aspirin.  ROS: A comprehensive was performed. Review of Systems  Constitutional: Negative for malaise/fatigue (Notably improved).  HENT: Negative for congestion and nosebleeds.   Respiratory: Negative for cough, hemoptysis and shortness of breath.   Cardiovascular: Negative for chest pain and palpitations.  Gastrointestinal: Negative for abdominal  pain, blood in stool, heartburn and melena.  Musculoskeletal: Negative for joint pain.  Neurological: Positive for dizziness (standing up quickly). Negative for weakness.  Endo/Heme/Allergies: Does not bruise/bleed easily.  Psychiatric/Behavioral: Negative for depression and memory loss. The patient is not nervous/anxious.   All other systems reviewed and are negative.    The patient does not have symptoms concerning for COVID-19 infection (fever, chills, cough, or new shortness of breath).  The patient is practicing social distancing.  Tries to wear the mask whenever he is near people, but this does not always happen.   COVID-19 Education: The signs and symptoms of COVID-19 were discussed with the patient and how to seek care for testing (follow up with PCP or arrange E-visit).   The importance of social distancing was discussed today.   I have reviewed and (if needed) personally updated the patient's problem list, medications, allergies, past medical and surgical history, social and family history.   Past Medical History:  Diagnosis Date  . CAD (coronary artery disease), native coronary artery    06/12/17 PCI with STENT SYNERGY DES 2.5X20 to mLAD.   . Diabetes (HCC)   . Hyperlipemia     Past Surgical History:  Procedure Laterality Date  . CORONARY STENT INTERVENTION N/A 06/12/2017   Procedure: Coronary Stent Intervention;  Surgeon: Marykay LexHarding,  W, MD;  [facility]: PCI to mLAD 99%-0%: STENT SYNERGY DES 2.5X20 (2.75 mm)  . LEFT HEART CATH AND CORONARY ANGIOGRAPHY N/A 06/12/2017   Procedure: Left Heart Cath and Coronary Angiography;  Surgeon: Marykay LexHarding,  W, MD;  [facility]: mLAd 99%, diffuse mild disease in RCA. EF ~45%  with antero-apical hypokinesis & mild LVEDP elevation.  . TRANSTHORACIC ECHOCARDIOGRAM  06/11/2017   In setting of delayed presentation MI/non-STEMI (LAD infarct with PCI to LAD):  EF 45-50%. Hypokinesis of mid-anteroseptal &  anterior/antero-apical wall.    Cardiac Cath-PCI June 12, 2017 -  Severe single vessel disease  - mid LAD 99% --> DES PCI ES stent. Mildly reduced EF ~45%, WM consistent with anterior MI. Culprit Lesion: Mid LAD lesion, 99 %stenosed: SYNERGY DES 2.5X20 --> 2.75 mm   Wall Motion           Diagnostic Diagram    Post-Intervention Diagram        Current Meds  Medication Sig  . clopidogrel (PLAVIX) 75 MG tablet Take 1 tablet (75 mg total) by mouth daily.  . metFORMIN (GLUCOPHAGE-XR) 500 MG 24 hr tablet Take 1,000 mg by mouth 2 (two) times daily.  . metoprolol tartrate (LOPRESSOR) 25 MG tablet Take 0.5 tablets (12.5 mg total) by mouth 2 (two) times daily.  . nitroGLYCERIN (NITROSTAT) 0.4 MG SL tablet Place 1 tablet (0.4 mg total) under the tongue every 5 (five) minutes as needed.  . pioglitazone (ACTOS) 30 MG tablet Take 30 mg by mouth daily.  . [DISCONTINUED] atorvastatin (LIPITOR) 80 MG tablet TAKE 1 TABLET BY MOUTH ONCE DAILY AT  6  PM  . [DISCONTINUED] metoprolol tartrate (LOPRESSOR) 25 MG tablet Take 1 tablet by mouth once daily    No Known Allergies  Social History   Tobacco Use  . Smoking status: Never Smoker  . Smokeless tobacco: Never Used  Substance Use Topics  . Alcohol use: No  . Drug use: No   Social History   Social History Narrative  . Not on file     family history includes Heart Problems in his mother; Heart attack (age of onset: 2645) in his maternal grandfather; High blood pressure in his brother.  Wt Readings from Last 3 Encounters:  06/24/19 178 lb 9.6 oz (81 kg)  09/07/18 177 lb 6.4 oz (80.5 kg)  01/27/18 187 lb (84.8 kg)    PHYSICAL EXAM BP 131/82   Pulse 65   Temp (!) 97 F (36.1 C)   Ht 5' 6.5" (1.689 m)   Wt 178 lb 9.6 oz (81 kg)   SpO2 98%   BMI 28.39 kg/m  Physical Exam  Constitutional: He is oriented to person, place, and time. He appears well-developed and well-nourished. No distress.  Healthy appearing. Well groomed.  HENT:  Head:  Normocephalic and atraumatic.  Mouth/Throat: Oropharynx is clear and moist.  Eyes: Pupils are equal, round, and reactive to light. Conjunctivae and EOM are normal.  Neck: Normal range of motion. Neck supple. No hepatojugular reflux and no JVD present. Carotid bruit is not present.  Cardiovascular: Normal rate, regular rhythm, normal heart sounds and intact distal pulses.  Occasional extrasystoles are present. PMI is not displaced. Exam reveals no gallop and no friction rub.  No murmur heard. Pulmonary/Chest: Effort normal and breath sounds normal. No respiratory distress. He has no wheezes. He has no rales.  Abdominal: Soft. Bowel sounds are normal. He exhibits no distension. There is no abdominal tenderness. There is no rebound.  Musculoskeletal: Normal range of motion.        General: No edema.  Neurological: He is alert and oriented to person, place, and time.  Skin: Skin is warm and dry. No rash noted.  Psychiatric: He has a normal mood and affect. His behavior is normal. Judgment and thought content normal.  Nursing note and vitals reviewed.    Adult ECG Report NSR, rate 65 Bpm.  Septal infarct, age undetermined.  Anterior T wave inversions no longer present.  Other studies Reviewed: Additional studies/ records that were reviewed today include:  [facility]   TC 95, TG 64, HDL 37, LDL 45.  A1c 7.7.  Cr 1.12. K+ 4.5. .  ASSESSMENT / PLAN: Problem List Items Addressed This Visit    Type 2 diabetes mellitus with complication, without long-term current use of insulin (HCC) (Chronic)    Newly diagnosed.  Has been on metformin initially alone but now on Actos.  With his having cardiomyopathy albeit mild and CAD, I would recommend not using Actos and converting to the dapagliflozin or empagliflozin as an alternative.  Much better cardioprotective data.      Relevant Medications   atorvastatin (LIPITOR) 40 MG tablet   Other Relevant Orders   EKG 12-Lead  (Completed)   Ischemic cardiomyopathy (Chronic)    Mildly reduced EF at the time of his anterior MI.  No active symptoms.  I would suspect that he probably regain some of that function back.  NYHA class I CHF symptoms.  Euvolemic on exam.  Continue beta-blocker (he is only taken 1/2 tablet twice daily.  Would consider converting to long-acting Toprol 25 mg daily next visit. With euvolemia and well-controlled blood pressures, will hold off on ACE inhibitor/ARB.  No diuretic.  Consider converting from Actos to dapagliflozin or empagliflozin for diabetes.      Relevant Medications   metoprolol tartrate (LOPRESSOR) 25 MG tablet   atorvastatin (LIPITOR) 40 MG tablet   Other Relevant Orders   EKG 12-Lead (Completed)   Hypertension (Chronic)    Well-controlled on current dose of beta-blocker.  Plan is to eventually converted to Toprol 25 mg daily.      Relevant Medications   metoprolol tartrate (LOPRESSOR) 25 MG tablet   atorvastatin (LIPITOR) 40 MG tablet   Hyperlipidemia with target low density lipoprotein (LDL) cholesterol less than 70 mg/dL (Chronic)    He is taking 40 mg of atorvastatin at this time.  We will converted to 40 mg tabs and follow-up recheck of labs that should be done in the next month or so.  If his LDL is stable, I would say we could probably switch him to 20 mg (one half of the 40 mg tablets)      Relevant Medications   metoprolol tartrate (LOPRESSOR) 25 MG tablet   atorvastatin (LIPITOR) 40 MG tablet   Coronary artery disease involving native coronary artery of native heart without angina pectoris (Chronic)    No active anginal symptoms on beta-blocker.  Is on statin and Plavix for maintenance.      Relevant Medications   metoprolol tartrate (LOPRESSOR) 25 MG tablet   atorvastatin (LIPITOR) 40 MG tablet   Other Relevant Orders   EKG 12-Lead (Completed)   CAD S/P percutaneous coronary angioplasty - Primary (Chronic)    History of LAD PCI.  Now on Plavix.  He is  now 2 years out, and we could theoretically stop Plavix however I think with the being LAD stent, I think 1 more year would be reasonable --> probably okay to stop as of next year.  For now on okay to hold Plavix 5 to 7 days preop for procedures.      Relevant Medications   metoprolol tartrate (LOPRESSOR) 25 MG tablet   atorvastatin (LIPITOR) 40 MG tablet   Other Relevant  Orders   EKG 12-Lead (Completed)      Current medicines are reviewed at length with the patient today. (+/- concerns) fatigue.- reaction to metoprolol The following changes have been made: none  Patient Instructions  Medication Instructions:   start taking Metoprolol tartrate  1/2 tablet of 25 mg twice a day. Decrease Atorvastatin 40 mg  one tablet daily.    If you need a refill on your cardiac medications before your next appointment, please call your pharmacy.   Lab work:  Not needed  Testing/Procedures: Not needed  Follow-Up: At [facility], you and your health needs are our priority.  As part of our continuing mission to provide you with exceptional heart care, we have created designated Provider Care Teams.  These Care Teams include your primary Cardiologist (physician) and Advanced Practice Providers (APPs -  Physician Assistants and Nurse Practitioners) who all work together to provide you with the care you need, when you need it. . You will need a follow up appointment in 12 months- July 16,2021.  Please call our office 2 months in advance to schedule this appointment.  You may see Brett Hew, MD or one of the following Advanced Practice Providers on your designated Care Team:   . Rosaria Ferries, PA-C . Jory Sims, DNP, ANP  Any Other Special Instructions Will Be Listed Below (If Applicable).   Studies Ordered:   Orders Placed This Encounter  Procedures  . EKG 12-Lead      Brett Collier, M.D., M.S. Interventional Cardiologist   Pager # [phone] Phone # [phone] [address] [place], [place] [address]

## 2019-06-26 ENCOUNTER — Encounter: Payer: Self-pay | Admitting: Cardiology

## 2019-06-26 NOTE — Assessment & Plan Note (Signed)
Mildly reduced EF at the time of his anterior MI.  No active symptoms.  I would suspect that he probably regain some of that function back.  NYHA class I CHF symptoms.  Euvolemic on exam.  Continue beta-blocker (he is only taken 1/2 tablet twice daily.  Would consider converting to long-acting Toprol 25 mg daily next visit. With euvolemia and well-controlled blood pressures, will hold off on ACE inhibitor/ARB.  No diuretic.  Consider converting from Actos to dapagliflozin or empagliflozin for diabetes.

## 2019-06-26 NOTE — Assessment & Plan Note (Signed)
No active anginal symptoms on beta-blocker.  Is on statin and Plavix for maintenance.

## 2019-06-26 NOTE — Assessment & Plan Note (Signed)
Newly diagnosed.  Has been on metformin initially alone but now on Actos.  With his having cardiomyopathy albeit mild and CAD, I would recommend not using Actos and converting to the dapagliflozin or empagliflozin as an alternative.  Much better cardioprotective data.

## 2019-06-26 NOTE — Assessment & Plan Note (Signed)
History of LAD PCI.  Now on Plavix.  He is now 2 years out, and we could theoretically stop Plavix however I think with the being LAD stent, I think 1 more year would be reasonable --> probably okay to stop as of next year.  For now on okay to hold Plavix 5 to 7 days preop for procedures.

## 2019-06-26 NOTE — Assessment & Plan Note (Signed)
Well-controlled on current dose of beta-blocker.  Plan is to eventually converted to Toprol 25 mg daily.

## 2019-06-26 NOTE — Assessment & Plan Note (Signed)
He is taking 40 mg of atorvastatin at this time.  We will converted to 40 mg tabs and follow-up recheck of labs that should be done in the next month or so.  If his LDL is stable, I would say we could probably switch him to 20 mg (one half of the 40 mg tablets)

## 2019-07-16 DIAGNOSIS — E119 Type 2 diabetes mellitus without complications: Secondary | ICD-10-CM | POA: Diagnosis not present

## 2019-07-16 DIAGNOSIS — Z1211 Encounter for screening for malignant neoplasm of colon: Secondary | ICD-10-CM | POA: Diagnosis not present

## 2019-07-16 DIAGNOSIS — Z0000 Encounter for general adult medical examination without abnormal findings: Secondary | ICD-10-CM | POA: Diagnosis not present

## 2019-07-16 DIAGNOSIS — Z1331 Encounter for screening for depression: Secondary | ICD-10-CM | POA: Diagnosis not present

## 2019-07-16 DIAGNOSIS — I2510 Atherosclerotic heart disease of native coronary artery without angina pectoris: Secondary | ICD-10-CM | POA: Diagnosis not present

## 2019-08-30 ENCOUNTER — Other Ambulatory Visit: Payer: Self-pay | Admitting: Cardiology

## 2019-12-27 DIAGNOSIS — M25512 Pain in left shoulder: Secondary | ICD-10-CM | POA: Diagnosis not present

## 2019-12-27 DIAGNOSIS — S4992XA Unspecified injury of left shoulder and upper arm, initial encounter: Secondary | ICD-10-CM | POA: Diagnosis not present

## 2019-12-27 DIAGNOSIS — M50322 Other cervical disc degeneration at C5-C6 level: Secondary | ICD-10-CM | POA: Diagnosis not present

## 2019-12-27 DIAGNOSIS — M542 Cervicalgia: Secondary | ICD-10-CM | POA: Diagnosis not present

## 2020-02-07 DIAGNOSIS — M5090 Cervical disc disorder, unspecified, unspecified cervical region: Secondary | ICD-10-CM | POA: Diagnosis not present

## 2020-02-07 DIAGNOSIS — Z125 Encounter for screening for malignant neoplasm of prostate: Secondary | ICD-10-CM | POA: Diagnosis not present

## 2020-02-07 DIAGNOSIS — I2510 Atherosclerotic heart disease of native coronary artery without angina pectoris: Secondary | ICD-10-CM | POA: Diagnosis not present

## 2020-02-07 DIAGNOSIS — E119 Type 2 diabetes mellitus without complications: Secondary | ICD-10-CM | POA: Diagnosis not present

## 2020-03-18 DIAGNOSIS — Z23 Encounter for immunization: Secondary | ICD-10-CM | POA: Diagnosis not present

## 2020-03-24 DIAGNOSIS — M546 Pain in thoracic spine: Secondary | ICD-10-CM | POA: Diagnosis not present

## 2020-03-24 DIAGNOSIS — M5413 Radiculopathy, cervicothoracic region: Secondary | ICD-10-CM | POA: Diagnosis not present

## 2020-03-24 DIAGNOSIS — M5441 Lumbago with sciatica, right side: Secondary | ICD-10-CM | POA: Diagnosis not present

## 2020-03-29 DIAGNOSIS — M5441 Lumbago with sciatica, right side: Secondary | ICD-10-CM | POA: Diagnosis not present

## 2020-03-29 DIAGNOSIS — M546 Pain in thoracic spine: Secondary | ICD-10-CM | POA: Diagnosis not present

## 2020-03-29 DIAGNOSIS — M5413 Radiculopathy, cervicothoracic region: Secondary | ICD-10-CM | POA: Diagnosis not present

## 2020-03-30 DIAGNOSIS — M5441 Lumbago with sciatica, right side: Secondary | ICD-10-CM | POA: Diagnosis not present

## 2020-03-30 DIAGNOSIS — M5413 Radiculopathy, cervicothoracic region: Secondary | ICD-10-CM | POA: Diagnosis not present

## 2020-03-30 DIAGNOSIS — M546 Pain in thoracic spine: Secondary | ICD-10-CM | POA: Diagnosis not present

## 2020-04-03 DIAGNOSIS — M546 Pain in thoracic spine: Secondary | ICD-10-CM | POA: Diagnosis not present

## 2020-04-03 DIAGNOSIS — M5413 Radiculopathy, cervicothoracic region: Secondary | ICD-10-CM | POA: Diagnosis not present

## 2020-04-03 DIAGNOSIS — M5441 Lumbago with sciatica, right side: Secondary | ICD-10-CM | POA: Diagnosis not present

## 2020-04-07 DIAGNOSIS — M5413 Radiculopathy, cervicothoracic region: Secondary | ICD-10-CM | POA: Diagnosis not present

## 2020-04-07 DIAGNOSIS — M5441 Lumbago with sciatica, right side: Secondary | ICD-10-CM | POA: Diagnosis not present

## 2020-04-07 DIAGNOSIS — M546 Pain in thoracic spine: Secondary | ICD-10-CM | POA: Diagnosis not present

## 2020-04-12 DIAGNOSIS — M5441 Lumbago with sciatica, right side: Secondary | ICD-10-CM | POA: Diagnosis not present

## 2020-04-12 DIAGNOSIS — M546 Pain in thoracic spine: Secondary | ICD-10-CM | POA: Diagnosis not present

## 2020-04-12 DIAGNOSIS — M5413 Radiculopathy, cervicothoracic region: Secondary | ICD-10-CM | POA: Diagnosis not present

## 2020-04-13 DIAGNOSIS — M546 Pain in thoracic spine: Secondary | ICD-10-CM | POA: Diagnosis not present

## 2020-04-13 DIAGNOSIS — M5413 Radiculopathy, cervicothoracic region: Secondary | ICD-10-CM | POA: Diagnosis not present

## 2020-04-13 DIAGNOSIS — M5441 Lumbago with sciatica, right side: Secondary | ICD-10-CM | POA: Diagnosis not present

## 2020-04-15 DIAGNOSIS — Z23 Encounter for immunization: Secondary | ICD-10-CM | POA: Diagnosis not present

## 2020-04-17 DIAGNOSIS — M5441 Lumbago with sciatica, right side: Secondary | ICD-10-CM | POA: Diagnosis not present

## 2020-04-17 DIAGNOSIS — M5413 Radiculopathy, cervicothoracic region: Secondary | ICD-10-CM | POA: Diagnosis not present

## 2020-04-17 DIAGNOSIS — M546 Pain in thoracic spine: Secondary | ICD-10-CM | POA: Diagnosis not present

## 2020-04-21 DIAGNOSIS — M5441 Lumbago with sciatica, right side: Secondary | ICD-10-CM | POA: Diagnosis not present

## 2020-04-21 DIAGNOSIS — M5413 Radiculopathy, cervicothoracic region: Secondary | ICD-10-CM | POA: Diagnosis not present

## 2020-04-21 DIAGNOSIS — M546 Pain in thoracic spine: Secondary | ICD-10-CM | POA: Diagnosis not present

## 2020-04-26 DIAGNOSIS — M546 Pain in thoracic spine: Secondary | ICD-10-CM | POA: Diagnosis not present

## 2020-04-26 DIAGNOSIS — M5413 Radiculopathy, cervicothoracic region: Secondary | ICD-10-CM | POA: Diagnosis not present

## 2020-04-26 DIAGNOSIS — M5441 Lumbago with sciatica, right side: Secondary | ICD-10-CM | POA: Diagnosis not present

## 2020-05-02 DIAGNOSIS — M546 Pain in thoracic spine: Secondary | ICD-10-CM | POA: Diagnosis not present

## 2020-05-02 DIAGNOSIS — M5413 Radiculopathy, cervicothoracic region: Secondary | ICD-10-CM | POA: Diagnosis not present

## 2020-05-02 DIAGNOSIS — M5441 Lumbago with sciatica, right side: Secondary | ICD-10-CM | POA: Diagnosis not present

## 2020-05-05 DIAGNOSIS — M546 Pain in thoracic spine: Secondary | ICD-10-CM | POA: Diagnosis not present

## 2020-05-05 DIAGNOSIS — M5441 Lumbago with sciatica, right side: Secondary | ICD-10-CM | POA: Diagnosis not present

## 2020-05-05 DIAGNOSIS — M5413 Radiculopathy, cervicothoracic region: Secondary | ICD-10-CM | POA: Diagnosis not present

## 2020-05-15 DIAGNOSIS — M5441 Lumbago with sciatica, right side: Secondary | ICD-10-CM | POA: Diagnosis not present

## 2020-05-15 DIAGNOSIS — M5413 Radiculopathy, cervicothoracic region: Secondary | ICD-10-CM | POA: Diagnosis not present

## 2020-05-15 DIAGNOSIS — M546 Pain in thoracic spine: Secondary | ICD-10-CM | POA: Diagnosis not present

## 2020-05-19 DIAGNOSIS — M5413 Radiculopathy, cervicothoracic region: Secondary | ICD-10-CM | POA: Diagnosis not present

## 2020-05-19 DIAGNOSIS — M546 Pain in thoracic spine: Secondary | ICD-10-CM | POA: Diagnosis not present

## 2020-05-19 DIAGNOSIS — M5441 Lumbago with sciatica, right side: Secondary | ICD-10-CM | POA: Diagnosis not present

## 2020-05-25 DIAGNOSIS — M5413 Radiculopathy, cervicothoracic region: Secondary | ICD-10-CM | POA: Diagnosis not present

## 2020-05-25 DIAGNOSIS — M546 Pain in thoracic spine: Secondary | ICD-10-CM | POA: Diagnosis not present

## 2020-05-25 DIAGNOSIS — M5441 Lumbago with sciatica, right side: Secondary | ICD-10-CM | POA: Diagnosis not present

## 2020-05-27 ENCOUNTER — Other Ambulatory Visit: Payer: Self-pay | Admitting: Cardiology

## 2020-05-29 DIAGNOSIS — M546 Pain in thoracic spine: Secondary | ICD-10-CM | POA: Diagnosis not present

## 2020-05-29 DIAGNOSIS — M5413 Radiculopathy, cervicothoracic region: Secondary | ICD-10-CM | POA: Diagnosis not present

## 2020-05-29 DIAGNOSIS — M5441 Lumbago with sciatica, right side: Secondary | ICD-10-CM | POA: Diagnosis not present

## 2020-06-16 DIAGNOSIS — M5441 Lumbago with sciatica, right side: Secondary | ICD-10-CM | POA: Diagnosis not present

## 2020-06-16 DIAGNOSIS — M5413 Radiculopathy, cervicothoracic region: Secondary | ICD-10-CM | POA: Diagnosis not present

## 2020-06-16 DIAGNOSIS — M546 Pain in thoracic spine: Secondary | ICD-10-CM | POA: Diagnosis not present

## 2020-06-21 DIAGNOSIS — M546 Pain in thoracic spine: Secondary | ICD-10-CM | POA: Diagnosis not present

## 2020-06-21 DIAGNOSIS — M5413 Radiculopathy, cervicothoracic region: Secondary | ICD-10-CM | POA: Diagnosis not present

## 2020-06-21 DIAGNOSIS — M5441 Lumbago with sciatica, right side: Secondary | ICD-10-CM | POA: Diagnosis not present

## 2020-06-22 ENCOUNTER — Other Ambulatory Visit: Payer: Self-pay

## 2020-06-22 ENCOUNTER — Encounter: Payer: Self-pay | Admitting: Cardiology

## 2020-06-22 ENCOUNTER — Ambulatory Visit: Payer: BC Managed Care – PPO | Admitting: Cardiology

## 2020-06-22 VITALS — BP 128/80 | HR 56 | Temp 97.1°F | Ht 66.5 in | Wt 168.0 lb

## 2020-06-22 DIAGNOSIS — R413 Other amnesia: Secondary | ICD-10-CM

## 2020-06-22 DIAGNOSIS — I214 Non-ST elevation (NSTEMI) myocardial infarction: Secondary | ICD-10-CM | POA: Diagnosis not present

## 2020-06-22 DIAGNOSIS — M79606 Pain in leg, unspecified: Secondary | ICD-10-CM | POA: Insufficient documentation

## 2020-06-22 DIAGNOSIS — I998 Other disorder of circulatory system: Secondary | ICD-10-CM

## 2020-06-22 DIAGNOSIS — I255 Ischemic cardiomyopathy: Secondary | ICD-10-CM | POA: Diagnosis not present

## 2020-06-22 DIAGNOSIS — I2510 Atherosclerotic heart disease of native coronary artery without angina pectoris: Secondary | ICD-10-CM | POA: Diagnosis not present

## 2020-06-22 DIAGNOSIS — Z9861 Coronary angioplasty status: Secondary | ICD-10-CM | POA: Diagnosis not present

## 2020-06-22 DIAGNOSIS — I10 Essential (primary) hypertension: Secondary | ICD-10-CM

## 2020-06-22 DIAGNOSIS — E118 Type 2 diabetes mellitus with unspecified complications: Secondary | ICD-10-CM

## 2020-06-22 DIAGNOSIS — E785 Hyperlipidemia, unspecified: Secondary | ICD-10-CM | POA: Diagnosis not present

## 2020-06-22 NOTE — Patient Instructions (Addendum)
Medication Instructions:   stop taking  Cholesterol medication ( atorvastatin  for 6 weeks call office if your memory  And or leg pain gets better.  *If you need a refill on your cardiac medications before your next appointment, please call your pharmacy*   Lab Work: Not needed If you have labs (blood work) drawn today and your tests are completely normal, you will receive your results only by: Marland Kitchen MyChart Message (if you have MyChart) OR . A paper copy in the mail If you have any lab test that is abnormal or we need to change your treatment, we will call you to review the results.   Testing/Procedures: Not needed   Follow-Up: At [facility], you and your health needs are our priority.  As part of our continuing mission to provide you with exceptional heart care, we have created designated Provider Care Teams.  These Care Teams include your primary Cardiologist (physician) and Advanced Practice Providers (APPs -  Physician Assistants and Nurse Practitioners) who all work together to provide you with the care you need, when you need it.  We recommend signing up for the patient portal called "MyChart".  Sign up information is provided on this After Visit Summary.  MyChart is used to connect with patients for Virtual Visits (Telemedicine).  Patients are able to view lab/test results, encounter notes, upcoming appointments, etc.  Non-urgent messages can be sent to your provider as well.   To learn more about what you can do with MyChart, go to [email or web address].    Your next appointment:   12 month(s)  The format for your next appointment:   In Person  Provider:   Bryan Lemma, MD  Other instruction

## 2020-06-22 NOTE — Progress Notes (Signed)
Primary Care Provider: Lonie Peak, PA-C Cardiologist: Bryan Lemma, MD Electrophysiologist: None  Clinic Note: Chief Complaint  Patient presents with  . Follow-up    No issues just some leg aching and memory issues  . Coronary Artery Disease    No angina    HPI:    Brett Collier is a 54 y.o. male with a CAD history as noted below who presents today for annual follow-up.    June 11, 2017: Non-STEMI--> EF 45-50% with anteroseptal and anteroapical hypokinesis. He had a LAD lesion treated with DES stent. He was noted to be diabetic in his hospitalization.Brett Collier was last seen on June 24, 2019 --> only noted issues with a pulled shoulder muscle.  Otherwise very active walking routinely.  Walks a lot at work and take stairs when possible.  No cardiac symptoms just may be some dizziness if he stands up too quickly.  Energy level completely improved.  Recent Hospitalizations: None  Reviewed  CV studies:    The following studies were reviewed today: (if available, images/films reviewed: From Epic Chart or Care Everywhere) . None:   Interval History:   Brett Collier is here today doing well from a cardiac standpoint.  He is still very active.  He walks about 3 to 4 miles just about every day.  He is also been making a concerted effort to change his diet eating much more healthy.  He has lost about 10 pounds.  He is not having any significant cardiac symptoms.  In fact the only thing he notes is that his memory has been a little off and his had some leg aching that is not really associate with exertion.  He is otherwise stable from cardiac standpoint  Cardiovascular Review of Symptoms (Summary): no chest pain or dyspnea on exertion positive for - Myalgias and memory issues noted negative for - edema, irregular heartbeat, orthopnea, palpitations, paroxysmal nocturnal dyspnea, rapid heart rate, shortness of breath or Lightheadedness, dizziness, wooziness or  syncope/near syncope, TIA/amaurosis fugax,  The patient does not have symptoms concerning for COVID-19 infection (fever, chills, cough, or new shortness of breath).  The patient is practicing social distancing & Masking.    REVIEWED OF SYSTEMS   Review of Systems  Constitutional: Positive for weight loss (He has lost about 10 pounds with increased walking and change his diet.). Negative for malaise/fatigue.  HENT: Negative for nosebleeds.   Respiratory: Negative for shortness of breath.   Gastrointestinal: Negative for blood in stool and melena.  Genitourinary: Negative for hematuria.  Musculoskeletal: Positive for myalgias (Bilateral leg discomfort-not with walking.) and neck pain (He has a pinched nerve in his neck that goes down his left arm.). Negative for falls.  Neurological: Negative for dizziness and focal weakness.  Psychiatric/Behavioral: Positive for memory loss (Is having little bit of issues with his memory.). The patient is not nervous/anxious and does not have insomnia.     I have reviewed and (if needed) personally updated the patient's problem list, medications, allergies, past medical and surgical history, social and family history.   PAST MEDICAL HISTORY   Past Medical History:  Diagnosis Date  . CAD (coronary artery disease), native coronary artery    06/12/17 PCI with STENT SYNERGY DES 2.5X20 to mLAD.   . Diabetes (HCC)   . Hyperlipemia     PAST SURGICAL HISTORY   Past Surgical History:  Procedure Laterality Date  . CORONARY STENT INTERVENTION N/A 06/12/2017   Procedure: Coronary Stent Intervention;  Surgeon: Herbie Baltimore,  Piedad Climes, MD;  Location: [facility] INVASIVE CV LAB: PCI to mLAD 99%-0%: STENT SYNERGY DES 2.5X20 (2.75 mm)  . LEFT HEART CATH AND CORONARY ANGIOGRAPHY N/A 06/12/2017   Procedure: Left Heart Cath and Coronary Angiography;  Surgeon: Marykay Lex, MD;  Location: [facility] INVASIVE CV LAB: mLAd 99%, diffuse mild disease in RCA. EF ~45% with antero-apical  hypokinesis & mild LVEDP elevation.  . TRANSTHORACIC ECHOCARDIOGRAM  06/11/2017   In setting of delayed presentation MI/non-STEMI (LAD infarct with PCI to LAD):  EF 45-50%. Hypokinesis of mid-anteroseptal & anterior/antero-apical wall.    Cardiac Cath-PCI June 12, 2017 -  Severe single vessel disease  - mid LAD 99% --> DES PCI DES stent (SYNERGY DES 2.5X20 --> 2.75 mm). Mildly reduced EF ~45%, WM consistent with anterior MI.  Wall Motion                        Diagnostic Diagram                                       Post-Intervention Diagram      MEDICATIONS/ALLERGIES   Current Meds  Medication Sig  . clopidogrel (PLAVIX) 75 MG tablet Take 1 tablet (75 mg total) by mouth daily. KEEP OV.  Marland Kitchen FARXIGA 10 MG TABS tablet Take 10 mg by mouth daily.  . metFORMIN (GLUCOPHAGE-XR) 500 MG 24 hr tablet Take 1,000 mg by mouth 2 (two) times daily.  . metoprolol tartrate (LOPRESSOR) 25 MG tablet Take 0.5 tablets (12.5 mg total) by mouth 2 (two) times daily.  . nitroGLYCERIN (NITROSTAT) 0.4 MG SL tablet Place 1 tablet (0.4 mg total) under the tongue every 5 (five) minutes as needed.  . pioglitazone (ACTOS) 30 MG tablet Take 30 mg by mouth daily.    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    OBJCTIVE -PE, EKG, labs   Wt Readings from Last 3 Encounters:  06/22/20 168 lb (76.2 kg)  06/24/19 178 lb 9.6 oz (81 kg)  09/07/18 177 lb 6.4 oz (80.5 kg)    Physical Exam: BP 128/80   Pulse (!) 56   Temp (!) 97.1 F (36.2 C)   Ht 5' 6.5" (1.689 m)   Wt 168 lb (76.2 kg)   SpO2 99%   BMI 26.71 kg/m  Physical Exam Vitals reviewed.  Constitutional:      General: He is not in acute distress.    Appearance: Normal appearance. He is normal weight. He is not ill-appearing.  HENT:     Head: Normocephalic and atraumatic.  Neck:     Vascular: No carotid bruit, hepatojugular reflux or JVD.  Cardiovascular:      Rate and Rhythm: Regular rhythm. Bradycardia present.  No extrasystoles are present.    Chest Wall: PMI is not displaced.     Pulses: Normal pulses and intact distal pulses.     Heart sounds: Normal heart sounds. No murmur heard.  No gallop.   Pulmonary:     Effort: Pulmonary effort is normal. No respiratory distress.     Breath sounds: Normal breath sounds.  Chest:     Chest wall: No tenderness.  Abdominal:     General: Abdomen is flat. Bowel sounds are normal.     Palpations: Abdomen is soft. There is no mass.  Comments: No HSM  Musculoskeletal:        General: No swelling. Normal range of motion.     Cervical back: Normal range of motion and neck supple.  Neurological:     General: No focal deficit present.     Mental Status: He is alert and oriented to person, place, and time.  Psychiatric:        Mood and Affect: Mood normal.        Behavior: Behavior normal.        Thought Content: Thought content normal.        Judgment: Judgment normal.     Adult ECG Report  Rate: 56 ;  Rhythm: sinus bradycardia and Septal MI, age undetermined.  Otherwise normal axis, intervals and durations.;   Narrative Interpretation: Stable EKG  Recent Labs: K PN February 07, 2020: TC 111, TG 71, HDL 33, LDLc 63.  A1c 6.7, Hgb 15.2, Cr 1.06.  K+ 4.5.  TSH 0.22. Lab Results  Component Value Date   CHOL 164 06/11/2017   HDL 29 (L) 06/11/2017   LDLCALC 109 (H) 06/11/2017   TRIG 129 06/11/2017   CHOLHDL 5.7 06/11/2017   Lab Results  Component Value Date   CREATININE 1.05 06/13/2017   BUN 16 06/13/2017   NA 136 06/13/2017   K 3.7 06/13/2017   CL 104 06/13/2017   CO2 25 06/13/2017   Lab Results  Component Value Date   TSH 0.949 06/11/2017    ASSESSMENT/PLAN    Problem List Items Addressed This Visit    Type 2 diabetes mellitus with complication, without long-term current use of insulin (HCC) (Chronic)    He has diabetes and I think Actos was stopped.  He is now on [place] plus Metformin  which are guideline directed for diabetes with CAD.      Relevant Medications   FARXIGA 10 MG TABS tablet   Essential hypertension (Chronic)    Blood pressure looks great on current medications.  He is taking low-dose metoprolol 12.5 mg twice daily only.      Ischemic cardiomyopathy (Chronic)    EF was mildly depressed at the time of his MI.  Not having any active heart failure symptoms.  Euvolemic on exam.  We actually back down his beta-blocker because of bradycardia and fatigue.  Feels much better with his current dose.  If pressures were to increase, would consider ACE inhibitor or ARB.  But for now with stable pressures, will leave medicines alone.      Relevant Orders   EKG 12-Lead (Completed)   NSTEMI (non-ST elevated myocardial infarction) (HCC) (Chronic)    He is now 3 years out from his MI.  Doing well.  He is definitely gotten his self together as far as diet, exercise and weight loss.  His A1c is pretty well controlled and I think his PCP may have stopped Actos.  He had mildly decreased EF at the time of his MI but no active heart failure or any further anginal symptoms.      Relevant Orders   EKG 12-Lead (Completed)   Hyperlipidemia with target low density lipoprotein (LDL) cholesterol less than 70 mg/dL (Chronic)    Currently on the atorvastatin 40 mg daily.  Labs look pretty good with an LDL 63.  I am little bit concerned about his memory issues and his leg pain.  I would like to give him a trial run of a statin holiday..  We will do a 6-week statin holiday and if  there is no change to either symptom, he can restart the current dose.  Otherwise we may need to readdress options.      CAD S/P percutaneous coronary angioplasty (Chronic)    LAD PCI.  On maintenance dose clopidogrel without aspirin.   Okay to hold Plavix 5 to 7 days preop for any surgeries or procedures.      Relevant Orders   EKG 12-Lead (Completed)   Coronary artery disease involving native coronary  artery of native heart without angina pectoris - Primary (Chronic)    He had mild disease in the RCA other than his LAD lesion.  No longer having any anginal pain.  He is on Plavix statin and beta-blocker all at max tolerated doses.  I encouraged continued exercise and weight loss.       Relevant Orders   EKG 12-Lead (Completed)   Memory loss    Memory loss may or may not be anything had to do with any cardiac medication, however it could be associate with statins and that with therefore we will have her do a 6-week holiday.  If no improvement, can then restart statin      Ischemic leg pain    I cannot tell if the leg pain is describing is truly claudication or more of her myalgias symptoms.  We will begin with a statin holiday to see if this makes a difference for myalgias, if not, would then consider ischemic evaluation ->with lower extremity arterial Dopplers          COVID-19 Education: The signs and symptoms of COVID-19 were discussed with the patient and how to seek care for testing (follow up with PCP or arrange E-visit).   The importance of social distancing and COVID-19 vaccination was discussed today.  I spent a total of 19minutes with the patient. >  50% of the time was spent in direct patient consultation.  Additional time spent with chart review  / charting (studies, outside notes, etc): 7 Total Time: 26 min   Current medicines are reviewed at length with the patient today.  (+/- concerns) n/a  Notice: This dictation was prepared with Dragon dictation along with smaller phrase technology. Any transcriptional errors that result from this process are unintentional and may not be corrected upon review.  Patient Instructions / Medication Changes & Studies & Tests Ordered   Patient Instructions  Medication Instructions:   stop taking  Cholesterol medication ( atorvastatin  for 6 weeks call office if your memory  And or leg pain gets better.  *If you need a refill on  your cardiac medications before your next appointment, please call your pharmacy*   Lab Work: Not needed If you have labs (blood work) drawn today and your tests are completely normal, you will receive your results only by: Marland Kitchen. MyChart Message (if you have MyChart) OR . A paper copy in the mail If you have any lab test that is abnormal or we need to change your treatment, we will call you to review the results.   Testing/Procedures: Not needed   Follow-Up: At [facility], you and your health needs are our priority.  As part of our continuing mission to provide you with exceptional heart care, we have created designated Provider Care Teams.  These Care Teams include your primary Cardiologist (physician) and Advanced Practice Providers (APPs -  Physician Assistants and Nurse Practitioners) who all work together to provide you with the care you need, when you need it.  We recommend signing  up for the patient portal called "MyChart".  Sign up information is provided on this After Visit Summary.  MyChart is used to connect with patients for Virtual Visits (Telemedicine).  Patients are able to view lab/test results, encounter notes, upcoming appointments, etc.  Non-urgent messages can be sent to your provider as well.   To learn more about what you can do with MyChart, go to [email or web address].    Your next appointment:   12 month(s)  The format for your next appointment:   In Person  Provider:   Bryan Lemma, MD  Other instruction      Studies Ordered:   Orders Placed This Encounter  Procedures  . EKG 12-Lead     Bryan Lemma, M.D., M.S. Interventional Cardiologist   Pager # [phone] Phone # [phone] [address] [place], [place] [address]   Thank you for choosing Heartcare at [facility]!!

## 2020-06-26 DIAGNOSIS — M546 Pain in thoracic spine: Secondary | ICD-10-CM | POA: Diagnosis not present

## 2020-06-26 DIAGNOSIS — M5413 Radiculopathy, cervicothoracic region: Secondary | ICD-10-CM | POA: Diagnosis not present

## 2020-06-26 DIAGNOSIS — M5441 Lumbago with sciatica, right side: Secondary | ICD-10-CM | POA: Diagnosis not present

## 2020-06-30 ENCOUNTER — Encounter: Payer: Self-pay | Admitting: Cardiology

## 2020-06-30 NOTE — Assessment & Plan Note (Signed)
Blood pressure looks great on current medications.  He is taking low-dose metoprolol 12.5 mg twice daily only.

## 2020-06-30 NOTE — Assessment & Plan Note (Signed)
He has diabetes and I think Actos was stopped.  He is now on [place] plus Metformin which are guideline directed for diabetes with CAD.

## 2020-06-30 NOTE — Assessment & Plan Note (Signed)
He is now 3 years out from his MI.  Doing well.  He is definitely gotten his self together as far as diet, exercise and weight loss.  His A1c is pretty well controlled and I think his PCP may have stopped Actos.  He had mildly decreased EF at the time of his MI but no active heart failure or any further anginal symptoms.

## 2020-06-30 NOTE — Assessment & Plan Note (Signed)
I cannot tell if the leg pain is describing is truly claudication or more of her myalgias symptoms.  We will begin with a statin holiday to see if this makes a difference for myalgias, if not, would then consider ischemic evaluation ->with lower extremity arterial Dopplers

## 2020-06-30 NOTE — Assessment & Plan Note (Signed)
Currently on the atorvastatin 40 mg daily.  Labs look pretty good with an LDL 63.  I am little bit concerned about his memory issues and his leg pain.  I would like to give him a trial run of a statin holiday..  We will do a 6-week statin holiday and if there is no change to either symptom, he can restart the current dose.  Otherwise we may need to readdress options.

## 2020-06-30 NOTE — Assessment & Plan Note (Addendum)
EF was mildly depressed at the time of his MI.  Not having any active heart failure symptoms.  Euvolemic on exam.  We actually back down his beta-blocker because of bradycardia and fatigue.  Feels much better with his current dose.  If pressures were to increase, would consider ACE inhibitor or ARB.  But for now with stable pressures, will leave medicines alone.

## 2020-06-30 NOTE — Assessment & Plan Note (Signed)
Memory loss may or may not be anything had to do with any cardiac medication, however it could be associate with statins and that with therefore we will have her do a 6-week holiday.  If no improvement, can then restart statin

## 2020-06-30 NOTE — Assessment & Plan Note (Signed)
LAD PCI.  On maintenance dose clopidogrel without aspirin.   Okay to hold Plavix 5 to 7 days preop for any surgeries or procedures.

## 2020-06-30 NOTE — Assessment & Plan Note (Signed)
He had mild disease in the RCA other than his LAD lesion.  No longer having any anginal pain.  He is on Plavix statin and beta-blocker all at max tolerated doses.  I encouraged continued exercise and weight loss.

## 2020-07-05 DIAGNOSIS — M546 Pain in thoracic spine: Secondary | ICD-10-CM | POA: Diagnosis not present

## 2020-07-05 DIAGNOSIS — M5441 Lumbago with sciatica, right side: Secondary | ICD-10-CM | POA: Diagnosis not present

## 2020-07-05 DIAGNOSIS — M5413 Radiculopathy, cervicothoracic region: Secondary | ICD-10-CM | POA: Diagnosis not present

## 2020-07-10 ENCOUNTER — Other Ambulatory Visit: Payer: Self-pay | Admitting: Cardiology

## 2020-07-10 DIAGNOSIS — M546 Pain in thoracic spine: Secondary | ICD-10-CM | POA: Diagnosis not present

## 2020-07-10 DIAGNOSIS — M5413 Radiculopathy, cervicothoracic region: Secondary | ICD-10-CM | POA: Diagnosis not present

## 2020-07-10 DIAGNOSIS — M5441 Lumbago with sciatica, right side: Secondary | ICD-10-CM | POA: Diagnosis not present

## 2020-08-11 DIAGNOSIS — Z1331 Encounter for screening for depression: Secondary | ICD-10-CM | POA: Diagnosis not present

## 2020-08-11 DIAGNOSIS — Z0000 Encounter for general adult medical examination without abnormal findings: Secondary | ICD-10-CM | POA: Diagnosis not present

## 2020-08-11 DIAGNOSIS — N529 Male erectile dysfunction, unspecified: Secondary | ICD-10-CM | POA: Diagnosis not present

## 2020-08-11 DIAGNOSIS — I2510 Atherosclerotic heart disease of native coronary artery without angina pectoris: Secondary | ICD-10-CM | POA: Diagnosis not present

## 2020-08-11 DIAGNOSIS — E0590 Thyrotoxicosis, unspecified without thyrotoxic crisis or storm: Secondary | ICD-10-CM | POA: Diagnosis not present

## 2020-08-11 DIAGNOSIS — E119 Type 2 diabetes mellitus without complications: Secondary | ICD-10-CM | POA: Diagnosis not present

## 2020-08-16 ENCOUNTER — Telehealth: Payer: Self-pay | Admitting: Cardiology

## 2020-08-16 NOTE — Telephone Encounter (Signed)
Patient states he saw his PCP and his cholesterol is high. He would like to know if he needs to go back on his cholesterol  medication.

## 2020-08-16 NOTE — Telephone Encounter (Signed)
Call returned to Pt.  Per Pt he had lab work at his PCP office and was advised his "bad cholesterol was high" and possibly his thyroid was outside of normal limits.  Per Pt-the PCP has sent lab work to Dr. Herbie Baltimore for review/advisement.  Per review of last office visit-Pt had a statin holiday d/t memory issues and leg pain.  Per Pt he thinks he did have improvement of these symptoms off the statin.  Advised Pt message would be sent to Dr. Herbie Baltimore.  Advised to await further orders from Dr. Herbie Baltimore.  Pt indicates understanding.

## 2020-08-17 NOTE — Telephone Encounter (Signed)
I do not have the labs results in front of me.  He did have a 6-week statin holiday.  If his lipids have gotten worse, then we do need to address other options.  Once I see the labs, we can review.  Bryan Lemma, MD

## 2020-08-18 NOTE — Telephone Encounter (Signed)
Attempted to call patient. Left message for patient to call back.  °

## 2020-08-24 NOTE — Telephone Encounter (Signed)
Attempted to contact pt x 2. Left message to call back 

## 2020-08-24 NOTE — Telephone Encounter (Signed)
Pt updated and voiced he will have PCP fax results for Dr. Herbie Baltimore to review.

## 2020-08-28 ENCOUNTER — Other Ambulatory Visit: Payer: Self-pay | Admitting: Cardiology

## 2020-09-19 NOTE — Telephone Encounter (Signed)
Patient called to check on status.  He wants to know if results have been received and reviewed.

## 2020-09-25 ENCOUNTER — Other Ambulatory Visit: Payer: Self-pay | Admitting: Cardiology

## 2020-10-12 ENCOUNTER — Other Ambulatory Visit: Payer: Self-pay | Admitting: Cardiology

## 2020-10-22 NOTE — Progress Notes (Signed)
Labs from PCP: 08/16/2020 - > labs finally made into the chart.  (Must have been placed in the wrong stack)  Na+ 138, K+ 4.4, Cl- 102, HCO3-23, BUN 20, Cr 0.6, Glu 107, Ca2+ 9.0; AST 19, ALT 20, AlkP 39 CBC: W 4.1, H/H 15.3/46.9, Plt 194 TC 152, TG 106, HDL 33 (L), LDL 99(H); TSH 0.35 (borderline low) -> free T4 1.45 which is normal.  This would indicate normal thyroid function.   Based on these results, the thyroid levels are okay.  Kidney function is fine.  Liver function is fine.  Other chemistries are okay but glucose is a little high.  We definitely saw worsening of cholesterol levels being off atorvastatin.  We stopped atorvastatin to see if memory and leg discomfort improved.  If there has been no change, then I would go back on atorvastatin 40 mg.  If there was improvement in symptoms being off atorvastatin, then I would probably switch to rosuvastatin 20 mg daily.  Would reassess lipid panel and chemistry panel in 3 months.   Bryan Lemma, MD

## 2020-10-31 ENCOUNTER — Telehealth: Payer: Self-pay | Admitting: *Deleted

## 2020-10-31 DIAGNOSIS — E785 Hyperlipidemia, unspecified: Secondary | ICD-10-CM

## 2020-10-31 DIAGNOSIS — E118 Type 2 diabetes mellitus with unspecified complications: Secondary | ICD-10-CM

## 2020-10-31 DIAGNOSIS — I2510 Atherosclerotic heart disease of native coronary artery without angina pectoris: Secondary | ICD-10-CM

## 2020-10-31 DIAGNOSIS — I255 Ischemic cardiomyopathy: Secondary | ICD-10-CM

## 2020-10-31 NOTE — Telephone Encounter (Signed)
-----   Message from Marykay Lex, MD sent at 10/22/2020  2:17 PM EST ----- Labs from PCP: 08/16/2020 - > labs finally made into the chart.  (Must have been placed in the wrong stack)  Na+ 138, K+ 4.4, Cl- 102, HCO3-23, BUN 20, Cr 0.6, Glu 107, Ca2+ 9.0; AST 19, ALT 20, AlkP 39 CBC: W 4.1, H/H 15.3/46.9, Plt 194 TC 152, TG 106, HDL 33 (L), LDL 99(H); TSH 0.35 (borderline low) -> free T4 1.45 which is normal.  This would indicate normal thyroid function.   Based on these results, the thyroid levels are okay.  Kidney function is fine.  Liver function is fine.  Other chemistries are okay but glucose is a little high.  We definitely saw worsening of cholesterol levels being off atorvastatin.  We stopped atorvastatin to see if memory and leg discomfort improved.  If there has been no change, then I would go back on atorvastatin 40 mg.  If there was improvement in symptoms being off atorvastatin, then I would probably switch to rosuvastatin 20 mg daily.  Would reassess lipid panel and chemistry panel in 3 months.   Bryan Lemma, MD

## 2020-10-31 NOTE — Telephone Encounter (Signed)
Left message to cal back for results

## 2020-11-09 NOTE — Telephone Encounter (Signed)
I am helping in Triage today. Called and notified patient of labs results per Dr. Elissa Hefty note below. He states memory and leg pain did not change after stopping the Atorvastatin. And he has already started back on this. Advised patient that Dr. Herbie Baltimore would like to recheck lipid panel and CMET in 3 months. He requested that this be done at his PCP's office because it is closer and cheaper for her. I told him that is fine.   Jasmine December, do we need to mail him a lab order or how does that work if he wants to have the labs done at his PCPs? Sorry, just want to make sure I am doing everything correctly.  Corrin Parker, PA-C 11/09/2020 12:10 PM

## 2020-11-09 NOTE — Telephone Encounter (Signed)
Patient returning call.

## 2020-11-10 NOTE — Telephone Encounter (Signed)
Placed order for CMP and Lipid to be done in 3 months - ( march 2022)  The labslip were mailed to patient  ( with instructions) . The patient would like to do labs at primary's office . That is okay as long as it is okay with primary .

## 2020-12-19 DIAGNOSIS — Z23 Encounter for immunization: Secondary | ICD-10-CM | POA: Diagnosis not present

## 2020-12-20 ENCOUNTER — Other Ambulatory Visit: Payer: Self-pay | Admitting: Cardiology

## 2021-01-09 ENCOUNTER — Other Ambulatory Visit: Payer: Self-pay | Admitting: Cardiology

## 2021-01-17 DIAGNOSIS — Z1211 Encounter for screening for malignant neoplasm of colon: Secondary | ICD-10-CM | POA: Diagnosis not present

## 2021-02-09 DIAGNOSIS — E119 Type 2 diabetes mellitus without complications: Secondary | ICD-10-CM | POA: Diagnosis not present

## 2021-02-09 DIAGNOSIS — I2510 Atherosclerotic heart disease of native coronary artery without angina pectoris: Secondary | ICD-10-CM | POA: Diagnosis not present

## 2021-02-09 DIAGNOSIS — Z125 Encounter for screening for malignant neoplasm of prostate: Secondary | ICD-10-CM | POA: Diagnosis not present

## 2021-02-09 DIAGNOSIS — M5090 Cervical disc disorder, unspecified, unspecified cervical region: Secondary | ICD-10-CM | POA: Diagnosis not present

## 2021-03-22 ENCOUNTER — Other Ambulatory Visit: Payer: Self-pay | Admitting: Cardiology

## 2021-04-15 ENCOUNTER — Other Ambulatory Visit: Payer: Self-pay | Admitting: Cardiology

## 2021-04-17 ENCOUNTER — Other Ambulatory Visit: Payer: Self-pay | Admitting: Cardiology

## 2021-06-01 ENCOUNTER — Other Ambulatory Visit: Payer: Self-pay

## 2021-06-01 MED ORDER — CLOPIDOGREL BISULFATE 75 MG PO TABS: 75.0000 mg | ORAL_TABLET | Freq: Every day | ORAL | 3 refills | Status: DC

## 2021-06-20 ENCOUNTER — Other Ambulatory Visit: Payer: Self-pay | Admitting: Cardiology

## 2021-06-22 ENCOUNTER — Telehealth: Payer: Self-pay | Admitting: Cardiology

## 2021-06-22 MED ORDER — ATORVASTATIN CALCIUM 40 MG PO TABS: 40.0000 mg | ORAL_TABLET | Freq: Every day | ORAL | 0 refills | Status: DC

## 2021-06-22 MED ORDER — ATORVASTATIN CALCIUM 40 MG PO TABS
40.0000 mg | ORAL_TABLET | Freq: Every day | ORAL | 0 refills | Status: DC
Start: 2021-06-22 — End: 2022-09-11

## 2021-06-22 NOTE — Telephone Encounter (Signed)
Pt c/o medication issue:  1. Name of Medication: atorvastatin (LIPITOR) 40 MG tablet  2. How are you currently taking this medication (dosage and times per day)?   3. Are you having a reaction (difficulty breathing--STAT)? No  4. What is your medication issue? Pt was informed by the pharmacy that this medicine has been discontinued and he is not sure why? Please advise pt further

## 2021-06-22 NOTE — Telephone Encounter (Signed)
Pt called reporting he received notice from pharmacy that his atorvastatin was discontinued advised pt of phone conversation on 12/2 and that MD wanted him to restart medication if symptom didn't improve after stopping medication. Pt state he was aware and have been on medication since.   New rx sent to requesting pharmacy,

## 2021-06-22 NOTE — Telephone Encounter (Signed)
Left message to call back  

## 2021-07-17 ENCOUNTER — Other Ambulatory Visit: Payer: Self-pay | Admitting: Cardiology

## 2021-08-02 ENCOUNTER — Encounter: Payer: Self-pay | Admitting: Cardiology

## 2021-08-02 ENCOUNTER — Other Ambulatory Visit: Payer: Self-pay

## 2021-08-02 ENCOUNTER — Ambulatory Visit (INDEPENDENT_AMBULATORY_CARE_PROVIDER_SITE_OTHER): Payer: BC Managed Care – PPO | Admitting: Cardiology

## 2021-08-02 VITALS — BP 130/76 | HR 58 | Ht 66.5 in | Wt 165.0 lb

## 2021-08-02 DIAGNOSIS — I2510 Atherosclerotic heart disease of native coronary artery without angina pectoris: Secondary | ICD-10-CM

## 2021-08-02 DIAGNOSIS — I255 Ischemic cardiomyopathy: Secondary | ICD-10-CM

## 2021-08-02 DIAGNOSIS — Z9861 Coronary angioplasty status: Secondary | ICD-10-CM

## 2021-08-02 DIAGNOSIS — E785 Hyperlipidemia, unspecified: Secondary | ICD-10-CM | POA: Diagnosis not present

## 2021-08-02 DIAGNOSIS — I214 Non-ST elevation (NSTEMI) myocardial infarction: Secondary | ICD-10-CM

## 2021-08-02 DIAGNOSIS — I10 Essential (primary) hypertension: Secondary | ICD-10-CM | POA: Diagnosis not present

## 2021-08-02 DIAGNOSIS — R413 Other amnesia: Secondary | ICD-10-CM

## 2021-08-02 DIAGNOSIS — R5383 Other fatigue: Secondary | ICD-10-CM

## 2021-08-02 NOTE — Patient Instructions (Addendum)
Medication Instructions:  Decrease  to atorvastatin 20 mg ( 1/2 tablet of 40 mg) for one month   to see if symptoms get better  if no better go back to 40 mg,if better  stay with 20 mg ( 1/2 tablet of 40 mg)   *If you need a refill on your cardiac medications before your next appointment, please call your pharmacy*   Lab Work: Not needed    Testing/Procedures: Not needed   Follow-Up: At [facility], you and your health needs are our priority.  As part of our continuing mission to provide you with exceptional heart care, we have created designated Provider Care Teams.  These Care Teams include your primary Cardiologist (physician) and Advanced Practice Providers (APPs -  Physician Assistants and Nurse Practitioners) who all work together to provide you with the care you need, when you need it.  We recommend signing up for the patient portal called "MyChart".  Sign up information is provided on this After Visit Summary.  MyChart is used to connect with patients for Virtual Visits (Telemedicine).  Patients are able to view lab/test results, encounter notes, upcoming appointments, etc.  Non-urgent messages can be sent to your provider as well.   To learn more about what you can do with MyChart, go to [email or web address].    Your next appointment:   12 month(s)  The format for your next appointment:   In Person  Provider:   Bryan Lemma, MD

## 2021-08-02 NOTE — Progress Notes (Signed)
Primary Care Provider: Lonie Peak, PA-C Cardiologist: Bryan Lemma, MD Electrophysiologist: None  Clinic Note: Chief Complaint  Patient presents with   Follow-up    Annual.  Noting some right arm/shoulder pain and also some myalgias.  Mild memory issues.   Coronary Artery Disease    No angina.  Exercising more    ===================================  ASSESSMENT/PLAN   Problem List Items Addressed This Visit       Cardiology Problems   Essential hypertension (Chronic)    Blood pressures are pretty stable on low-dose beta-blocker.  However if his pressures were to increase, would strongly consider ACE inhibitor or ARB since he is diabetic.      Relevant Orders   EKG 12-Lead (Completed)   Ischemic cardiomyopathy (Chronic)    Mildly reduced EF at the time of his MI.  No active heart failure symptoms.  Has not had any issues with CHF since his MI.    On stable low-dose beta-blocker.  Not currently on ARB, however if blood pressures increase, would recommend ACE inhibitor or ARB.      Relevant Orders   EKG 12-Lead (Completed)   NSTEMI (non-ST elevated myocardial infarction) (HCC) (Chronic)    4 years out from his MI.  Doing well.   Continued slow and steady weight loss.  Exercising routinely now.  No angina or heart failure symptoms.      Hyperlipidemia with target low density lipoprotein (LDL) cholesterol less than 70 mg/dL (Chronic)    Most recent lipids showed LDL of 45.  Well within goal.  I think with him having some myalgias and memory issues we can afford to do a statin holiday.  Decrease  to atorvastatin 20 mg ( 1/2 tablet of 40 mg) for one month   to see if symptoms get better  if no better go back to 40 mg,if better  stay with 20 mg ( 1/2 tablet of 40 mg)   Remains on Farxiga and metformin for diabetes.  He is also on pioglitazone/Actos which I would recommend stopping and consider switching to a different medication.  Defer to PCP.      Relevant Orders    EKG 12-Lead (Completed)   CAD S/P percutaneous coronary angioplasty (Chronic)    On maintenance clopidogrel monotherapy for ongoing management of CAD. Okay to hold Plavix/clopidogrel for surgeries or procedures by 5 to 7 days preop.       Coronary artery disease involving native coronary artery of native heart without angina pectoris - Primary (Chronic)    Mild RCA disease with PCI to the LAD.  No recurrent anginal symptoms.  He is on statin which we are currently adjusting along with low-dose beta-blocker. On maintenance dose clopidogrel-okay to hold for procedures. Also along [place] and metformin along with Actos for her diabetes        Other   Fatigue due to treatment    Fatigue seems to be better with reduced dose of beta-blocker.  He is now having some myalgias.  Plan to hold atorvastatin for 66month to reassess      Memory loss    Retry statin holiday and potentially reduce dose.      ===================================  HPI:    Brett Collier is a 55 y.o. male with a PMH notable for CAD-non-STEMI (see below), HTN, HLD and DM-2 who presents today for annual follow-up.  June 11, 2017: Non-STEMI--> EF 45-50% with anteroseptal and anteroapical hypokinesis. He had a LAD lesion treated with DES stent. He was noted  to be diabetic in his hospitalization.Brett Collier was last seen on June 22, 2020.  Was very stable car standpoint.  Walking about 3 to 4 miles about every other day.  Making concerted effort to change his diet and eat healthy.  Had lost about 10 pounds.  No cardiac symptoms.  Distal leg aching off and on but not associate with exertion.  Recent Hospitalizations: None  Reviewed  CV studies:    The following studies were reviewed today: (if available, images/films reviewed: From Epic Chart or Care Everywhere) None:   Interval History:   Brett Collier returns for annual follow-up overall doing very well.  No major issues.  No chest pain or pressure.   Just little bit of right arm pain.  He says that his blood pressures are low but better than the were here today.  Usually more than 120s.  He has continued to exercise regularly with no resting exertional chest pain or pressure..  Memory issues seem to have improved, but still there every now and then.  Did not really seem to have much benefit from holding a statin.  CV Review of Symptoms (Summary) Cardiovascular ROS: no chest pain or dyspnea on exertion positive for - mild arthralgias/myalgias and memory issues. negative for - chest pain, dyspnea on exertion, edema, irregular heartbeat, orthopnea, palpitations, paroxysmal nocturnal dyspnea, rapid heart rate, shortness of breath, or lightheadedness, dizziness or wooziness, syncope/near syncope or TIA/amaurosis fugax, claudication  REVIEWED OF SYSTEMS   Review of Systems  Constitutional:  Negative for chills, fever, malaise/fatigue and weight loss.  HENT:  Negative for congestion and nosebleeds.   Respiratory: Negative.    Cardiovascular:  Negative for leg swelling.       Per HPI  Gastrointestinal:  Negative for blood in stool and melena.  Genitourinary:  Negative for dysuria and hematuria.  Musculoskeletal:  Positive for joint pain (Minimal aches and pains -> most notably right arm and shoulder pain.  Thinks he may have injured himself at work.).  Neurological:  Negative for dizziness, focal weakness and weakness.  Psychiatric/Behavioral:  Positive for memory loss. The patient is not nervous/anxious and does not have insomnia.    I have reviewed and (if needed) personally updated the patient's problem list, medications, allergies, past medical and surgical history, social and family history.   PAST MEDICAL HISTORY   Past Medical History:  Diagnosis Date   CAD (coronary artery disease), native coronary artery    06/12/17 PCI with STENT SYNERGY DES 2.5X20 to mLAD.    Diabetes (HCC)    Hyperlipemia     PAST SURGICAL HISTORY   Past  Surgical History:  Procedure Laterality Date   CORONARY STENT INTERVENTION N/A 06/12/2017   Procedure: Coronary Stent Intervention;  Surgeon: Marykay Lex, MD;  Location: [facility] INVASIVE CV LAB: PCI to mLAD 99%-0%: STENT SYNERGY DES 2.5X20 (2.75 mm)   LEFT HEART CATH AND CORONARY ANGIOGRAPHY N/A 06/12/2017   Procedure: Left Heart Cath and Coronary Angiography;  Surgeon: Marykay Lex, MD;  Location: [facility] INVASIVE CV LAB: mLAd 99%, diffuse mild disease in RCA. EF ~45% with antero-apical hypokinesis & mild LVEDP elevation.   TRANSTHORACIC ECHOCARDIOGRAM  06/11/2017   In setting of delayed presentation MI/non-STEMI (LAD infarct with PCI to LAD):  EF 45-50%. Hypokinesis of mid-anteroseptal & anterior/antero-apical wall.   Cardiac Cath-PCI June 12, 2017 -  Severe single vessel disease  - mid LAD 99% --> DES PCI DES stent (SYNERGY DES 2.5X20 --> 2.75 mm). Mildly reduced EF ~  45%, WM consistent with anterior MI.   Wall Motion                        Diagnostic Diagram                                       Post-Intervention Diagram       There is no immunization history on file for this patient.  MEDICATIONS/ALLERGIES   Current Meds  Medication Sig   atorvastatin (LIPITOR) 40 MG tablet Take 1 tablet (40 mg total) by mouth daily.   clopidogrel (PLAVIX) 75 MG tablet Take 1 tablet (75 mg total) by mouth daily.   FARXIGA 10 MG TABS tablet Take 10 mg by mouth daily.   metFORMIN (GLUCOPHAGE-XR) 500 MG 24 hr tablet Take 1,000 mg by mouth 2 (two) times daily.   metoprolol tartrate (LOPRESSOR) 25 MG tablet Take 1/2 (one-half) tablet by mouth twice daily   nitroGLYCERIN (NITROSTAT) 0.4 MG SL tablet Place 1 tablet (0.4 mg total) under the tongue every 5 (five) minutes as needed.   pioglitazone (ACTOS) 30 MG tablet Take 30 mg by mouth daily.    No Known Allergies  SOCIAL HISTORY/FAMILY HISTORY   Reviewed in Epic:  Pertinent findings:  Social History   Tobacco Use   Smoking status: Never   Smokeless  tobacco: Never  Substance Use Topics   Alcohol use: No   Drug use: No   Social History   Social History Narrative   Not on file    OBJCTIVE -PE, EKG, labs   Wt Readings from Last 3 Encounters:  08/02/21 165 lb (74.8 kg)  06/22/20 168 lb (76.2 kg)  06/24/19 178 lb 9.6 oz (81 kg)    Physical Exam: BP 130/76   Pulse (!) 58   Ht 5' 6.5" (1.689 m)   Wt 165 lb (74.8 kg)   SpO2 99%   BMI 26.23 kg/m  Physical Exam Vitals reviewed.  Constitutional:      General: He is not in acute distress.    Appearance: Normal appearance. He is normal weight. He is not ill-appearing (Healthy-appearing.  Well-groomed.  Well-nourished.) or toxic-appearing.  HENT:     Head: Normocephalic and atraumatic.  Neck:     Vascular: No carotid bruit or JVD.  Cardiovascular:     Rate and Rhythm: Regular rhythm. Bradycardia present. No extrasystoles are present.    Chest Wall: PMI is not displaced.     Pulses: Normal pulses and intact distal pulses.     Heart sounds: Normal heart sounds, S1 normal and S2 normal.    No friction rub. No gallop.  Pulmonary:     Effort: Pulmonary effort is normal. No respiratory distress.     Breath sounds: Normal breath sounds. No stridor. No wheezing, rhonchi or rales.  Abdominal:     General: Abdomen is flat. Bowel sounds are normal. There is no distension.     Palpations: Abdomen is soft.     Tenderness: There is no abdominal tenderness.  Musculoskeletal:        General: No swelling. Normal range of motion.     Cervical back: Normal range of motion and neck supple.  Skin:    General: Skin is warm and dry.     Coloration: Skin is not pale.  Neurological:     General: No focal deficit present.  Mental Status: He is alert and oriented to person, place, and time. Mental status is at baseline.  Psychiatric:        Mood and Affect: Mood normal.        Behavior: Behavior normal.        Thought Content: Thought content normal.        Judgment: Judgment normal.      Adult ECG Report  Rate: 58 ;  Rhythm: sinus bradycardia and Septal MI- age indeterminate; ; normal axis, intervals and durations.  Narrative Interpretation: Stable  Recent Labs:   02/09/2021: TC 91, TG 71.  HDL 31, LDL 45.  A1c 7.2.  Hgb 15.3. Cr 1.07, K+ 4.6.  TSH 0.35. Lab Results  Component Value Date   CHOL 164 06/11/2017   HDL 29 (L) 06/11/2017   LDLCALC 109 (H) 06/11/2017   TRIG 129 06/11/2017   CHOLHDL 5.7 06/11/2017   Lab Results  Component Value Date   CREATININE 1.05 06/13/2017   BUN 16 06/13/2017   NA 136 06/13/2017   K 3.7 06/13/2017   CL 104 06/13/2017   CO2 25 06/13/2017   CBC Latest Ref Rng & Units 06/13/2017 06/12/2017 06/11/2017  WBC 4.0 - 10.5 K/uL 5.5 4.8 5.3  Hemoglobin 13.0 - 17.0 g/dL 55.714.0 32.214.8 02.514.4  Hematocrit 39.0 - 52.0 % 41.5 43.0 42.2  Platelets 150 - 400 K/uL 193 191 187    Lab Results  Component Value Date   HGBA1C 7.9 (H) 06/11/2017   Lab Results  Component Value Date   TSH 0.949 06/11/2017    ==================================================  COVID-19 Education: The signs and symptoms of COVID-19 were discussed with the patient and how to seek care for testing (follow up with PCP or arrange E-visit).    I spent a total of 16 minutes with the patient spent in direct patient consultation.  Additional time spent with chart review  / charting (studies, outside notes, etc): 15 min Total Time: 31 min  Current medicines are reviewed at length with the patient today.  (+/- concerns) n/a  This visit occurred during the SARS-CoV-2 public health emergency.  Safety protocols were in place, including screening questions prior to the visit, additional usage of staff PPE, and extensive cleaning of exam room while observing appropriate contact time as indicated for disinfecting solutions.  Notice: This dictation was prepared with Dragon dictation along with smaller phrase technology. Any transcriptional errors that result from this process are  unintentional and may not be corrected upon review.  Patient Instructions / Medication Changes & Studies & Tests Ordered   Patient Instructions  Medication Instructions:  Decrease  to atorvastatin 20 mg ( 1/2 tablet of 40 mg) for one month   to see if symptoms get better  if no better go back to 40 mg,if better  stay with 20 mg ( 1/2 tablet of 40 mg)   *If you need a refill on your cardiac medications before your next appointment, please call your pharmacy*   Lab Work: Not needed    Testing/Procedures: Not needed   Follow-Up: At [facility], you and your health needs are our priority.  As part of our continuing mission to provide you with exceptional heart care, we have created designated Provider Care Teams.  These Care Teams include your primary Cardiologist (physician) and Advanced Practice Providers (APPs -  Physician Assistants and Nurse Practitioners) who all work together to provide you with the care you need, when you need it.  We recommend signing up  for the patient portal called "MyChart".  Sign up information is provided on this After Visit Summary.  MyChart is used to connect with patients for Virtual Visits (Telemedicine).  Patients are able to view lab/test results, encounter notes, upcoming appointments, etc.  Non-urgent messages can be sent to your provider as well.   To learn more about what you can do with MyChart, go to [email or web address].    Your next appointment:   12 month(s)  The format for your next appointment:   In Person  Provider:   Bryan Lemma, MD   Studies Ordered:   Orders Placed This Encounter  Procedures   EKG 12-Lead     Bryan Lemma, M.D., M.S. Interventional Cardiologist   Pager # [phone] Phone # [phone] [address] [place], [place] [address]   Thank you for choosing Heartcare at [facility]!!

## 2021-08-13 ENCOUNTER — Encounter: Payer: Self-pay | Admitting: Cardiology

## 2021-08-13 NOTE — Assessment & Plan Note (Signed)
On maintenance clopidogrel monotherapy for ongoing management of CAD.  Okay to hold Plavix/clopidogrel for surgeries or procedures by 5 to 7 days preop.

## 2021-08-13 NOTE — Assessment & Plan Note (Signed)
Fatigue seems to be better with reduced dose of beta-blocker.  He is now having some myalgias.  Plan to hold atorvastatin for 33month to reassess

## 2021-08-13 NOTE — Assessment & Plan Note (Signed)
Retry statin holiday and potentially reduce dose.

## 2021-08-13 NOTE — Assessment & Plan Note (Signed)
4 years out from his MI.  Doing well.   Continued slow and steady weight loss.  Exercising routinely now.  No angina or heart failure symptoms.

## 2021-08-13 NOTE — Assessment & Plan Note (Signed)
Mild RCA disease with PCI to the LAD.  No recurrent anginal symptoms.  He is on statin which we are currently adjusting along with low-dose beta-blocker. On maintenance dose clopidogrel-okay to hold for procedures. Also along [place] and metformin along with Actos for her diabetes

## 2021-08-13 NOTE — Assessment & Plan Note (Addendum)
Most recent lipids showed LDL of 45.  Well within goal.  I think with him having some myalgias and memory issues we can afford to do a statin holiday.  Decrease  to atorvastatin 20 mg ( 1/2 tablet of 40 mg) for one month   to see if symptoms get better  if no better go back to 40 mg,if better  stay with 20 mg ( 1/2 tablet of 40 mg)   Remains on Farxiga and metformin for diabetes.  He is also on pioglitazone/Actos which I would recommend stopping and consider switching to a different medication.  Defer to PCP.

## 2021-08-13 NOTE — Assessment & Plan Note (Signed)
Blood pressures are pretty stable on low-dose beta-blocker.  However if his pressures were to increase, would strongly consider ACE inhibitor or ARB since he is diabetic.

## 2021-08-13 NOTE — Assessment & Plan Note (Signed)
Mildly reduced EF at the time of his MI.  No active heart failure symptoms.  Has not had any issues with CHF since his MI.    On stable low-dose beta-blocker.  Not currently on ARB, however if blood pressures increase, would recommend ACE inhibitor or ARB.

## 2021-08-16 DIAGNOSIS — Z1331 Encounter for screening for depression: Secondary | ICD-10-CM | POA: Diagnosis not present

## 2021-08-16 DIAGNOSIS — Z0000 Encounter for general adult medical examination without abnormal findings: Secondary | ICD-10-CM | POA: Diagnosis not present

## 2021-08-16 DIAGNOSIS — I2510 Atherosclerotic heart disease of native coronary artery without angina pectoris: Secondary | ICD-10-CM | POA: Diagnosis not present

## 2021-08-16 DIAGNOSIS — E119 Type 2 diabetes mellitus without complications: Secondary | ICD-10-CM | POA: Diagnosis not present

## 2021-08-16 DIAGNOSIS — N529 Male erectile dysfunction, unspecified: Secondary | ICD-10-CM | POA: Diagnosis not present

## 2021-10-15 ENCOUNTER — Other Ambulatory Visit: Payer: Self-pay | Admitting: Cardiology

## 2022-02-13 DIAGNOSIS — E119 Type 2 diabetes mellitus without complications: Secondary | ICD-10-CM | POA: Diagnosis not present

## 2022-02-13 DIAGNOSIS — I2510 Atherosclerotic heart disease of native coronary artery without angina pectoris: Secondary | ICD-10-CM | POA: Diagnosis not present

## 2022-02-13 DIAGNOSIS — Z125 Encounter for screening for malignant neoplasm of prostate: Secondary | ICD-10-CM | POA: Diagnosis not present

## 2022-02-13 DIAGNOSIS — M79606 Pain in leg, unspecified: Secondary | ICD-10-CM | POA: Diagnosis not present

## 2022-03-13 DIAGNOSIS — E119 Type 2 diabetes mellitus without complications: Secondary | ICD-10-CM | POA: Diagnosis not present

## 2022-05-25 NOTE — Progress Notes (Signed)
Rapid reviewed: Labs from 08/16/2021: Na+ 142, K+ 4.5, Cl- 104, HCO3-23, BUN 16, Cr 1.01, Glu 111, Ca2+ 9.4; AST 20, ALT 24, AlkP 43 CBC: W 3.9, H/H 15/45.2, Plt 195 TC 100, TG 78, HDL 33, LDL 5.1; A1c 6.5.  Bryan Lemma, MD

## 2022-06-10 ENCOUNTER — Other Ambulatory Visit: Payer: Self-pay | Admitting: Cardiology

## 2022-07-19 ENCOUNTER — Other Ambulatory Visit: Payer: Self-pay | Admitting: Cardiology

## 2022-08-20 DIAGNOSIS — Z0000 Encounter for general adult medical examination without abnormal findings: Secondary | ICD-10-CM | POA: Diagnosis not present

## 2022-08-20 DIAGNOSIS — I2510 Atherosclerotic heart disease of native coronary artery without angina pectoris: Secondary | ICD-10-CM | POA: Diagnosis not present

## 2022-08-20 DIAGNOSIS — E119 Type 2 diabetes mellitus without complications: Secondary | ICD-10-CM | POA: Diagnosis not present

## 2022-08-20 DIAGNOSIS — Z1331 Encounter for screening for depression: Secondary | ICD-10-CM | POA: Diagnosis not present

## 2022-09-09 ENCOUNTER — Other Ambulatory Visit: Payer: Self-pay | Admitting: Cardiology

## 2022-09-11 ENCOUNTER — Encounter: Payer: Self-pay | Admitting: Cardiology

## 2022-09-11 ENCOUNTER — Ambulatory Visit: Payer: BC Managed Care – PPO | Attending: Cardiology | Admitting: Cardiology

## 2022-09-11 VITALS — BP 138/72 | HR 55 | Ht 67.0 in | Wt 170.6 lb

## 2022-09-11 DIAGNOSIS — E785 Hyperlipidemia, unspecified: Secondary | ICD-10-CM

## 2022-09-11 DIAGNOSIS — I255 Ischemic cardiomyopathy: Secondary | ICD-10-CM | POA: Diagnosis not present

## 2022-09-11 DIAGNOSIS — I10 Essential (primary) hypertension: Secondary | ICD-10-CM

## 2022-09-11 DIAGNOSIS — I2510 Atherosclerotic heart disease of native coronary artery without angina pectoris: Secondary | ICD-10-CM | POA: Diagnosis not present

## 2022-09-11 DIAGNOSIS — E118 Type 2 diabetes mellitus with unspecified complications: Secondary | ICD-10-CM

## 2022-09-11 DIAGNOSIS — I214 Non-ST elevation (NSTEMI) myocardial infarction: Secondary | ICD-10-CM

## 2022-09-11 DIAGNOSIS — T466X5A Adverse effect of antihyperlipidemic and antiarteriosclerotic drugs, initial encounter: Secondary | ICD-10-CM | POA: Insufficient documentation

## 2022-09-11 DIAGNOSIS — Z9861 Coronary angioplasty status: Secondary | ICD-10-CM

## 2022-09-11 DIAGNOSIS — G720 Drug-induced myopathy: Secondary | ICD-10-CM | POA: Diagnosis not present

## 2022-09-11 DIAGNOSIS — R413 Other amnesia: Secondary | ICD-10-CM

## 2022-09-11 MED ORDER — ROSUVASTATIN CALCIUM 20 MG PO TABS: 20.0000 mg | ORAL_TABLET | Freq: Every day | ORAL | 3 refills | Status: DC

## 2022-09-11 NOTE — Progress Notes (Signed)
Primary Care Provider: Arlyss Queenonroy, Nathan, PA-C [facility] Cardiologist: Bryan Lemmaavid Marnette Perkins, MD Electrophysiologist: None  Clinic Note: Chief Complaint  Patient presents with   Follow-up    Annual--> not taking atorvastatin anymore because of leg pains.  Feels a lot better.  Also memory better.   Coronary Artery Disease    No angina   ===================================  ASSESSMENT/PLAN   Problem List Items Addressed This Visit       Cardiology Problems   Essential hypertension (Chronic)    He tells me his blood pressures at home are usually in the 130s.  Currently 138/72.  Borderline.  Of asked that he monitor his pressures make sure that that averaging higher 130s versus lower 30s.  He is not really much in the way of any blood pressure medicines besides low-dose beta-blocker.  We could consider ARB especially with him being a diabetic.  If blood pressure average is greater than 135/80, would recommend initiating low-dose ARB such as losartan 25 mg daily.      Relevant Medications   rosuvastatin (CRESTOR) 20 MG tablet   Ischemic cardiomyopathy (Chronic)    EF by Echo at the time of his MI was 45 to 50%.  We have not rechecked since.  He has not had any active symptoms to suggest CHF.  On Farxiga and beta-blocker. He is only on low-dose beta-blocker.  Low threshold to initiate low-dose ARB if pressures increase especially with him being diabetic. Would also recommend discontinue pioglitazone because of concerns with volume overload      Relevant Medications   rosuvastatin (CRESTOR) 20 MG tablet   Other Relevant Orders   EKG 12-Lead (Completed)   CAD S/P percutaneous coronary angioplasty - Primary (Chronic)    Doing well.  No active angina.   Remains on Plavix therapy without any bleeding issues.  Plan: Continue low-dose beta-blocker with low threshold to add ARB (losartan 25 mg daily) We will continue [place] Initiate attempted treatment with Crestor.  Refer to  CVRR unable to tolerate, or if lipids not at goal. Continue Plavix-okay to stop Plavix 7 days preop for surgeries or procedures.      Relevant Medications   rosuvastatin (CRESTOR) 20 MG tablet   Hyperlipidemia with target low density lipoprotein (LDL) cholesterol less than 70 mg/dL (Chronic)    Unfortunately, he is not on a statin anymore and his lipids show that.  His LDL is at 107.  Plan: We will start Crestor 20 mg tablets.  He will start off with half tablet for 2 weeks and then he is already okay go to 20 mg.  If he does not tolerate the 20 mg, he will go back to the half tablet. Recheck labs in December.  Depending on results will refer to CVRR clinical pharmacist for lipid management.  I suspect that if he is not able to tolerate both atorvastatin and rosuvastatin, that he probably need PCSK9 inhibitor.  Would also consider the possibility inclisiran to ensure adherence.      Relevant Medications   rosuvastatin (CRESTOR) 20 MG tablet   Other Relevant Orders   EKG 12-Lead (Completed)   Hepatic function panel   Lipid panel   [facility]   NSTEMI (non-ST elevated myocardial infarction) (HCC) (Chronic)    5 years out from MI.  Doing well.  No angina or heart failure symptoms. Working on adjusting lipid management and considering a PE.  Also need to discontinue pioglitazone      Relevant Medications  rosuvastatin (CRESTOR) 20 MG tablet   Other Relevant Orders   EKG 12-Lead (Completed)   Hepatic function panel   Lipid panel     Other   Type 2 diabetes mellitus with complication, without long-term current use of insulin (HCC) (Chronic)    He is back on Actos along with [place] and metformin.  Recommend stopping Actos.  Defer further management to PCP but Actos is borderline contraindicated for personal reduced EF.      Relevant Medications   rosuvastatin (CRESTOR) 20 MG tablet   Memory loss    This time around the stent holiday felt better.  No longer  on statin.  She is doing better.      Statin myopathy    He did not tolerate restarting atorvastatin.  Feels much better off of it.  Both myopathy and memory issues.  We will try with Crestor at lower dose, but if not tolerated, plan to refer for lipid clinic management with likely PCSK9 inhibitor.      Relevant Orders   EKG 12-Lead (Completed)   Hepatic function panel   Lipid panel   AMB Referral to [facility]    ===================================  HPI:    Brett Collier is a 56 y.o. male with a PMH below who presents today for annual follow-up. Referring Provider: Lonie Peak, PA-C.  PMH notable for CAD-non-STEMI (see below), HTN, HLD and DM-2   June 11, 2017: Non-STEMI--> EF 45-50% with anteroseptal and anteroapical hypokinesis. He had a LAD lesion treated with DES stent. He was noted to be diabetic in his hospitalization.[name] was last seen on 08/02/2021 -> doing very well overall.  No major issues.  No chest pain or pressure.  [place] right arm pain.  Blood pressures tend to be lower than recorded pressure usually in the 120s systolic.  Exercising regularly with no resting or exertional chest pain or pressure.  Did not really notice much difference holding his statin.  Was having some memory issues but those are also improving.  Recent Hospitalizations: None  Reviewed  CV studies:    The following studies were reviewed today: (if available, images/films reviewed: From Epic Chart or Care Everywhere) None:  Interval History:   Nehemiah Mcfarren returns here today overall doing pretty well.  He does note that since I last saw him he was having a lot of issues with aching and pains in his legs and again some memory issues.  He gave it another try a statin holiday, and this time his legs felt much better.  He cannot tell me exactly when he stopped taking atorvastatin but he is no longer taking atorvastatin.  He is still active now that his legs feel better.   He exercises but not routinely.  Still staying very active.  He denies any chest pain or pressure with rest exertion.  No exertional dyspnea.  No PND, orthopnea or edema.  CV Review of Symptoms (Summary):  no chest pain or dyspnea on exertion positive for - musculoskeletal pains-leg pains with statin, therefore stopped that. negative for - edema, irregular heartbeat, orthopnea, palpitations, paroxysmal nocturnal dyspnea, rapid heart rate, shortness of breath, or lightheadedness, dizziness or wooziness, syncope/near syncope, TIA/amaurosis fugax, claudication  REVIEWED OF SYSTEMS   Review of systems essentially negative not noted in HPI: Minor additional symptoms include: Mild diffuse joint pains, myalgias with statin improved.  Memory issues still somewhat better off statin.  I have reviewed and (if needed) personally updated the patient's problem list, medications, allergies,  past medical and surgical history, social and family history.   PAST MEDICAL HISTORY   Past Medical History:  Diagnosis Date   CAD (coronary artery disease), native coronary artery    06/12/17 PCI with STENT SYNERGY DES 2.5X20 to mLAD.    Diabetes (HCC)    Hyperlipemia     PAST SURGICAL HISTORY   Past Surgical History:  Procedure Laterality Date   CORONARY STENT INTERVENTION N/A 06/12/2017   Procedure: Coronary Stent Intervention;  Surgeon: Marykay Lex, MD;  [facility] INVASIVE CV LAB: PCI to mLAD 99%-0%: STENT SYNERGY DES 2.5X20 (2.75 mm)   LEFT HEART CATH AND CORONARY ANGIOGRAPHY N/A 06/12/2017   Procedure: Left Heart Cath and Coronary Angiography;  Surgeon: Marykay Lex, MD;  Location: [facility] INVASIVE CV LAB: mLAd 99%, diffuse mild disease in RCA. EF ~45% with antero-apical hypokinesis & mild LVEDP elevation.   TRANSTHORACIC ECHOCARDIOGRAM  06/11/2017   In setting of delayed presentation MI/non-STEMI (LAD infarct with PCI to LAD):  EF 45-50%. Hypokinesis of mid-anteroseptal & anterior/antero-apical wall.    Cardiac Cath-PCI June 12, 2017 -  Severe single vessel disease  - mid LAD 99% --> DES PCI DES stent (SYNERGY DES 2.5X20 --> 2.75 mm). Mildly reduced EF ~45%, WM consistent with anterior MI.   Wall Motion                        Diagnostic Diagram                                       Post-Intervention Diagram       There is no immunization history on file for this patient.  MEDICATIONS/ALLERGIES   Current Meds  Medication Sig   clopidogrel (PLAVIX) 75 MG tablet Take 1 tablet by mouth once daily   FARXIGA 10 MG TABS tablet Take 10 mg by mouth daily.   metFORMIN (GLUCOPHAGE-XR) 500 MG 24 hr tablet Take 1,000 mg by mouth 2 (two) times daily.   metoprolol tartrate (LOPRESSOR) 25 MG tablet Take 1/2 (one-half) tablet by mouth twice daily   nitroGLYCERIN (NITROSTAT) 0.4 MG SL tablet Place 1 tablet (0.4 mg total) under the tongue every 5 (five) minutes as needed.   pioglitazone (ACTOS) 30 MG tablet Take 30 mg by mouth daily.   rosuvastatin (CRESTOR) 20 MG tablet Take 1 tablet (20 mg total) by mouth daily.  ?? Not on Statin  -- noted leg pain  Allergies  Allergen Reactions   Atorvastatin     Profound muscle aches    SOCIAL HISTORY/FAMILY HISTORY   Reviewed in Epic:  Pertinent findings:  Social History   Tobacco Use   Smoking status: Never   Smokeless tobacco: Never  Substance Use Topics   Alcohol use: No   Drug use: No   Social History   Social History Narrative   Not on file    OBJCTIVE -PE, EKG, labs   Wt Readings from Last 3 Encounters:  09/11/22 170 lb 9.6 oz (77.4 kg)  08/02/21 165 lb (74.8 kg)  06/22/20 168 lb (76.2 kg)    Physical Exam: BP 138/72   Pulse (!) 55   Ht 5\' 7"  (1.702 m)   Wt 170 lb 9.6 oz (77.4 kg)   SpO2 99%   BMI 26.72 kg/m  Physical Exam Vitals reviewed.  Constitutional:      General: He is not in acute distress.  Appearance: Normal appearance. He is normal weight. He is not ill-appearing or toxic-appearing.  HENT:     Head:  Normocephalic and atraumatic.  Neck:     Vascular: No carotid bruit or JVD.  Cardiovascular:     Rate and Rhythm: Regular rhythm. Bradycardia present. Occasional Extrasystoles are present.    Chest Wall: PMI is not displaced.     Pulses: Normal pulses.     Heart sounds: S1 normal and S2 normal. No murmur heard.    No friction rub. No gallop.  Pulmonary:     Effort: Pulmonary effort is normal. No respiratory distress.     Breath sounds: Normal breath sounds. No wheezing, rhonchi or rales.  Musculoskeletal:        General: No swelling. Normal range of motion.     Cervical back: Normal range of motion and neck supple.  Skin:    General: Skin is warm and dry.  Neurological:     General: No focal deficit present.     Mental Status: He is alert and oriented to person, place, and time.     Gait: Gait normal.  Psychiatric:        Mood and Affect: Mood normal.        Behavior: Behavior normal.        Thought Content: Thought content normal.        Judgment: Judgment normal.     Adult ECG Report  Rate: 55 ;  Rhythm: sinus bradycardia and septal MI, age-indeterminate.  Otherwise normal axis and durations. ;   Narrative Interpretation: Stable  Recent Labs: 08/20/2022 TC 158, TG 94, HDL 33, LDL 107.  A1c 7.2.  Hgb 15.7.  Cr 1.03, K+ 4.6, TSH 0.56. Lab Results  Component Value Date   CHOL 164 06/11/2017   HDL 29 (L) 06/11/2017   LDLCALC 109 (H) 06/11/2017   TRIG 129 06/11/2017   CHOLHDL 5.7 06/11/2017   Lab Results  Component Value Date   CREATININE 1.05 06/13/2017   BUN 16 06/13/2017   NA 136 06/13/2017   K 3.7 06/13/2017   CL 104 06/13/2017   CO2 25 06/13/2017      Latest Ref Rng & Units 06/13/2017    2:56 AM 06/12/2017    4:11 AM 06/11/2017    4:28 AM  CBC  WBC 4.0 - 10.5 K/uL 5.5  4.8  5.3   Hemoglobin 13.0 - 17.0 g/dL 14.0  14.8  14.4   Hematocrit 39.0 - 52.0 % 41.5  43.0  42.2   Platelets 150 - 400 K/uL 193  191  187     Lab Results  Component Value Date   HGBA1C 7.9  (H) 06/11/2017   Lab Results  Component Value Date   TSH 0.949 06/11/2017    ================================================== I spent a total of 16 minutes with the patient spent in direct patient consultation.  Additional time spent with chart review  / charting (studies, outside notes, etc): 15 min Total Time: 31 min  Current medicines are reviewed at length with the patient today.  (+/- concerns) n/a  Notice: This dictation was prepared with Dragon dictation along with smart phrase technology. Any transcriptional errors that result from this process are unintentional and may not be corrected upon review.  Studies Ordered:   Orders Placed This Encounter  Procedures   Hepatic function panel   Lipid panel   AMB Referral to [facility]   EKG 12-Lead   Meds ordered this encounter  Medications   rosuvastatin (  CRESTOR) 20 MG tablet    Sig: Take 1 tablet (20 mg total) by mouth daily.    Dispense:  90 tablet    Refill:  3    Patient Instructions / Medication Changes & Studies & Tests Ordered   Patient Instructions  Medication Instructions:    Stop Atorvastatin     Start Rosuvastatin 20 mg  for the first 2 weeks take 1/2 tablet after the 2weeks increase to whole tablet daily , if you develop muscle aches taking the whole tablet then decrease back down to 1/2 tablet (10 mg)  daily   *If you need a refill on your cardiac medications before your next appointment, please call your pharmacy*   Lab Work: in Dec 2023  fasting  Lipid Liver panel    If you have labs (blood work) drawn today and your tests are completely normal, you will receive your results only by: MyChart Message (if you have MyChart) OR A paper copy in the mail If you have any lab test that is abnormal or we need to change your treatment, we will call you to review the results.   Testing/Procedures:  Not needed  Follow-Up: At [facility], you and your health needs are our priority.  As part  of our continuing mission to provide you with exceptional heart care, we have created designated Provider Care Teams.  These Care Teams include your primary Cardiologist (physician) and Advanced Practice Providers (APPs -  Physician Assistants and Nurse Practitioners) who all work together to provide you with the care you need, when you need it.  We recommend signing up for the patient portal called "MyChart".  Sign up information is provided on this After Visit Summary.  MyChart is used to connect with patients for Virtual Visits (Telemedicine).  Patients are able to view lab/test results, encounter notes, upcoming appointments, etc.  Non-urgent messages can be sent to your provider as well.   To learn more about what you can do with MyChart, go to [email or web address].    Your next appointment:   12 month(s)  The format for your next appointment:   In Person  Provider:   Bryan Lemma, MD   You have been referred to [facility]RR at [facility] in Dec 2023  follow up lipids    Other Instructions      Marykay Lex, MD, MS Bryan Lemma, M.D., M.S. Interventional Cardiologist  [facility]  Pager # [phone] Phone # [phone] [address] [place], [place] [address]   Thank you for choosing [facility] [place]!!

## 2022-09-11 NOTE — Patient Instructions (Signed)
Medication Instructions:    Stop Atorvastatin     Start Rosuvastatin 20 mg  for the first 2 weeks take 1/2 tablet after the 2weeks increase to whole tablet daily , if you develop muscle aches taking the whole tablet then decrease back down to 1/2 tablet (10 mg)  daily   *If you need a refill on your cardiac medications before your next appointment, please call your pharmacy*   Lab Work: in Dec 2023  fasting  Lipid Liver panel    If you have labs (blood work) drawn today and your tests are completely normal, you will receive your results only by: [place] (if you have MyChart) OR A paper copy in the mail If you have any lab test that is abnormal or we need to change your treatment, we will call you to review the results.   Testing/Procedures:  Not needed  Follow-Up: At [facility], you and your health needs are our priority.  As part of our continuing mission to provide you with exceptional heart care, we have created designated Provider Care Teams.  These Care Teams include your primary Cardiologist (physician) and Advanced Practice Providers (APPs -  Physician Assistants and Nurse Practitioners) who all work together to provide you with the care you need, when you need it.  We recommend signing up for the patient portal called "MyChart".  Sign up information is provided on this After Visit Summary.  MyChart is used to connect with patients for Virtual Visits (Telemedicine).  Patients are able to view lab/test results, encounter notes, upcoming appointments, etc.  Non-urgent messages can be sent to your provider as well.   To learn more about what you can do with MyChart, go to [email or web address].    Your next appointment:   12 month(s)  The format for your next appointment:   In Person  Provider:   Glenetta Hew, MD   You have been referred to [facility]RR at [facility] in Dec 2023  follow up lipids    Other Instructions

## 2022-09-28 ENCOUNTER — Encounter: Payer: Self-pay | Admitting: Cardiology

## 2022-09-28 NOTE — Assessment & Plan Note (Signed)
He tells me his blood pressures at home are usually in the 130s.  Currently 138/72.  Borderline.  Of asked that he monitor his pressures make sure that that averaging higher 130s versus lower 30s.  He is not really much in the way of any blood pressure medicines besides low-dose beta-blocker.  We could consider ARB especially with him being a diabetic.  If blood pressure average is greater than 135/80, would recommend initiating low-dose ARB such as losartan 25 mg daily.

## 2022-09-28 NOTE — Assessment & Plan Note (Signed)
He is back on Actos along with [place] and metformin.  Recommend stopping Actos.  Defer further management to PCP but Actos is borderline contraindicated for personal reduced EF.

## 2022-09-28 NOTE — Assessment & Plan Note (Signed)
This time around the stent holiday felt better.  No longer on statin.  She is doing better.

## 2022-09-28 NOTE — Assessment & Plan Note (Addendum)
Doing well.  No active angina.   Remains on Plavix therapy without any bleeding issues.  Plan: Continue low-dose beta-blocker with low threshold to add ARB (losartan 25 mg daily)  We will continue [place] attempted treatment with Crestor.  Refer to CVRR unable to tolerate, or if lipids not at goal.  Continue Plavix-okay to stop Plavix 7 days preop for surgeries or procedures.

## 2022-09-28 NOTE — Assessment & Plan Note (Signed)
5 years out from MI.  Doing well.  No angina or heart failure symptoms. Working on adjusting lipid management and considering a PE.  Also need to discontinue pioglitazone

## 2022-09-28 NOTE — Assessment & Plan Note (Signed)
He did not tolerate restarting atorvastatin.  Feels much better off of it.  Both myopathy and memory issues.  We will try with Crestor at lower dose, but if not tolerated, plan to refer for lipid clinic management with likely PCSK9 inhibitor.

## 2022-09-28 NOTE — Assessment & Plan Note (Signed)
Unfortunately, he is not on a statin anymore and his lipids show that.  His LDL is at 107.  Plan: We will start Crestor 20 mg tablets.  He will start off with half tablet for 2 weeks and then he is already okay go to 20 mg.  If he does not tolerate the 20 mg, he will go back to the half tablet. Recheck labs in December.  Depending on results will refer to CVRR clinical pharmacist for lipid management.  I suspect that if he is not able to tolerate both atorvastatin and rosuvastatin, that he probably need PCSK9 inhibitor.  Would also consider the possibility inclisiran to ensure adherence.

## 2022-09-28 NOTE — Assessment & Plan Note (Addendum)
EF by Echo at the time of his MI was 45 to 50%.  We have not rechecked since.  He has not had any active symptoms to suggest CHF.  On Farxiga and beta-blocker. He is only on low-dose beta-blocker.  Low threshold to initiate low-dose ARB if pressures increase especially with him being diabetic. Would also recommend discontinue pioglitazone because of concerns with volume overload

## 2022-10-13 ENCOUNTER — Other Ambulatory Visit: Payer: Self-pay | Admitting: Cardiology

## 2022-11-11 DIAGNOSIS — G720 Drug-induced myopathy: Secondary | ICD-10-CM | POA: Diagnosis not present

## 2022-11-11 DIAGNOSIS — E785 Hyperlipidemia, unspecified: Secondary | ICD-10-CM | POA: Diagnosis not present

## 2022-11-11 DIAGNOSIS — T466X5A Adverse effect of antihyperlipidemic and antiarteriosclerotic drugs, initial encounter: Secondary | ICD-10-CM | POA: Diagnosis not present

## 2022-11-11 DIAGNOSIS — I214 Non-ST elevation (NSTEMI) myocardial infarction: Secondary | ICD-10-CM | POA: Diagnosis not present

## 2022-11-12 LAB — HEPATIC FUNCTION PANEL
ALT: 28 IU/L (ref 0–44)
AST: 20 IU/L (ref 0–40)
Albumin: 4.8 g/dL (ref 3.8–4.9)
Alkaline Phosphatase: 48 IU/L (ref 44–121)
Bilirubin Total: 0.5 mg/dL (ref 0.0–1.2)
Bilirubin, Direct: 0.14 mg/dL (ref 0.00–0.40)
Total Protein: 7.4 g/dL (ref 6.0–8.5)

## 2022-11-12 LAB — LIPID PANEL
Chol/HDL Ratio: 3.1 ratio (ref 0.0–5.0)
Cholesterol, Total: 95 mg/dL — ABNORMAL LOW (ref 100–199)
HDL: 31 mg/dL — ABNORMAL LOW (ref >39)
LDL Chol Calc (NIH): 48 mg/dL (ref 0–99)
Triglycerides: 73 mg/dL (ref 0–149)
VLDL Cholesterol Cal: 16 mg/dL (ref 5–40)

## 2022-11-20 ENCOUNTER — Encounter: Payer: Self-pay | Admitting: Cardiology

## 2022-11-20 ENCOUNTER — Ambulatory Visit: Payer: BC Managed Care – PPO | Attending: Cardiovascular Disease | Admitting: Student

## 2022-11-20 DIAGNOSIS — E785 Hyperlipidemia, unspecified: Secondary | ICD-10-CM

## 2022-11-20 NOTE — Assessment & Plan Note (Addendum)
Assessment:  LDL goal: <70 mg/dl last LDLc 48 mg/dl (23/7628) Tolerates high intensity statins well without any side effects   Plan:  No changes LDLc below 70 mg/dl Continue taking current medications Crestor 20 mg daily Lipid lab due in 12 moths follow up with Dr.harding annually

## 2022-11-20 NOTE — Progress Notes (Signed)
Patient ID: Brett Collier                 DOB: Feb 06, 1966                    MRN: [id number]      HPI: Brett Collier is a 56 y.o. male patient referred to lipid clinic by Aundra Dubin, PA-C. PMH is significant for hypertension, ischemic cardiomyopathy, CAD S/P percutaneous coronary angioplasty, HDL, NSTEMI (5 years ago),  When patient saw Dr Ellyn Hack in Oct Crestor 20 was started; LDL 107. Patient has been tolerating Crestor 20 mg well without any side effects.  Recent lab looks excellent LDLc 48 mg/dl. Not making any changes.  Current Medications: Crestor 20 mg  Intolerances: atorvastatin - myopathy and memory issues  Risk Factors: CAD, hypertension,  ischemic cardiomyopathy, NSTEMI (5 years ago), T2DM LDL goal: <55  Last LDL: 48 mg/dl (11/11/2022)  Diet: eat whatever he wants but he normally eats small portions and healthy food (grilled and baked meat, low carb, low salt)  Exercise: brisk walking 2-3 miles per day 5 days week.   Family History:  Whole mother side family- heart disease  Father- irregular heartbeats  Social History:  Alcohol: none Recreational drug: none  Smoking: never  Labs: Lipid Panel     Component Value Date/Time   CHOL 95 (L) 11/11/2022 1004   TRIG 73 11/11/2022 1004   HDL 31 (L) 11/11/2022 1004   CHOLHDL 3.1 11/11/2022 1004   CHOLHDL 5.7 06/11/2017 0428   VLDL 26 06/11/2017 0428   LDLCALC 48 11/11/2022 1004   LABVLDL 16 11/11/2022 1004    Past Medical History:  Diagnosis Date   CAD (coronary artery disease), native coronary artery    06/12/17 PCI with STENT SYNERGY DES 2.5X20 to mLAD.    Diabetes ([place])    Hyperlipemia     Current Outpatient Medications on File Prior to Visit  Medication Sig Dispense Refill   clopidogrel (PLAVIX) 75 MG tablet Take 1 tablet by mouth once daily 90 tablet 0   FARXIGA 10 MG TABS tablet Take 10 mg by mouth daily.     metFORMIN (GLUCOPHAGE-XR) 500 MG 24 hr tablet Take 1,000 mg by mouth 2 (two) times daily.      metoprolol tartrate (LOPRESSOR) 25 MG tablet Take 1/2 (one-half) tablet by mouth twice daily 90 tablet 3   nitroGLYCERIN (NITROSTAT) 0.4 MG SL tablet Place 1 tablet (0.4 mg total) under the tongue every 5 (five) minutes as needed. 25 tablet 2   pioglitazone (ACTOS) 30 MG tablet Take 30 mg by mouth daily.     rosuvastatin (CRESTOR) 20 MG tablet Take 1 tablet (20 mg total) by mouth daily. 90 tablet 3   No current facility-administered medications on file prior to visit.    Allergies  Allergen Reactions   Atorvastatin     Profound muscle aches     Problem  Hyperlipidemia With Target Low Density Lipoprotein (Ldl) Cholesterol Less Than 70 Mg/Dl   Current Medications: Crestor 20 mg  Intolerances: atorvastatin - myopathy and memory issues  Risk Factors: CAD, hypertension,  ischemic cardiomyopathy, NSTEMI (5 years ago), T2DM LDL goal: <70  Last LDL: 48 mg/dl (11/11/2022)    Hyperlipidemia with target low density lipoprotein (LDL) cholesterol less than 70 mg/dL Assessment:  LDL goal: <70 mg/dl last LDLc 48 mg/dl (11/2022) Tolerates high intensity statins well without any side effects   Plan:  No changes LDLc below 70 mg/dl Continue taking current medications Crestor 20  mg daily Lipid lab due in 12 moths follow up with Dr.harding annually   Thank you,  Carmela Hurt, Pharm.D [facility] of [facility] [address], [place], [place] [address]  Phone: [phone]; Fax: [phone]

## 2022-11-21 NOTE — Telephone Encounter (Signed)
Is fine.  Okay to hold for 2 weeks

## 2022-12-06 ENCOUNTER — Other Ambulatory Visit: Payer: Self-pay | Admitting: Cardiology

## 2023-02-26 DIAGNOSIS — M7918 Myalgia, other site: Secondary | ICD-10-CM | POA: Diagnosis not present

## 2023-02-26 DIAGNOSIS — I2510 Atherosclerotic heart disease of native coronary artery without angina pectoris: Secondary | ICD-10-CM | POA: Diagnosis not present

## 2023-02-26 DIAGNOSIS — Z125 Encounter for screening for malignant neoplasm of prostate: Secondary | ICD-10-CM | POA: Diagnosis not present

## 2023-02-26 DIAGNOSIS — M79606 Pain in leg, unspecified: Secondary | ICD-10-CM | POA: Diagnosis not present

## 2023-02-26 DIAGNOSIS — E119 Type 2 diabetes mellitus without complications: Secondary | ICD-10-CM | POA: Diagnosis not present

## 2023-02-27 LAB — LAB REPORT - SCANNED
Albumin, Urine POC: 10.6
Creatinine, POC: 90.3 mg/dL
EGFR: 95
Microalb Creat Ratio: 12
PSA, Total: 0.3

## 2023-06-28 NOTE — Progress Notes (Signed)
Labs from 02/26/2023: Ordered by Lonie Peak, PA Na+ 139, K+ 4.8, Cl- 101, HCO3-23, BUN 18, Cr 0.94, Glu 113, Ca2+ 10.1; AST 16, ALT 26, AlkP 51  TC 98, TG 94, HDL 31, LDL 49-outstanding  Bryan Lemma, MD

## 2023-07-09 ENCOUNTER — Encounter: Payer: Self-pay | Admitting: *Deleted

## 2023-08-27 DIAGNOSIS — Z0000 Encounter for general adult medical examination without abnormal findings: Secondary | ICD-10-CM | POA: Diagnosis not present

## 2023-08-27 DIAGNOSIS — E119 Type 2 diabetes mellitus without complications: Secondary | ICD-10-CM | POA: Diagnosis not present

## 2023-08-27 DIAGNOSIS — Z1331 Encounter for screening for depression: Secondary | ICD-10-CM | POA: Diagnosis not present

## 2023-08-27 DIAGNOSIS — M791 Myalgia, unspecified site: Secondary | ICD-10-CM | POA: Diagnosis not present

## 2023-09-12 ENCOUNTER — Ambulatory Visit: Payer: BC Managed Care – PPO | Attending: Physician Assistant | Admitting: Physician Assistant

## 2023-09-12 VITALS — BP 120/62 | HR 61 | Ht 67.0 in | Wt 166.2 lb

## 2023-09-12 DIAGNOSIS — E785 Hyperlipidemia, unspecified: Secondary | ICD-10-CM | POA: Diagnosis not present

## 2023-09-12 DIAGNOSIS — E119 Type 2 diabetes mellitus without complications: Secondary | ICD-10-CM

## 2023-09-12 DIAGNOSIS — I10 Essential (primary) hypertension: Secondary | ICD-10-CM | POA: Diagnosis not present

## 2023-09-12 DIAGNOSIS — I2510 Atherosclerotic heart disease of native coronary artery without angina pectoris: Secondary | ICD-10-CM

## 2023-09-12 NOTE — Progress Notes (Unsigned)
Cardiology Office Note:  .   Date:  09/14/2023  ID:  Brett Collier, DOB [birth year], MRN [id number] PCP: Lonie Peak, Cordelia Poche  [facility] Providers Cardiologist:  Bryan Lemma, MD     History of Present Illness: .   Brett Collier is a 57 y.o. male with PMH of HTN, HLD intolerant of statins due to myalgia and memory issues, DM II, ICM and CAD.  Patient had NSTEMI in July 2018.  EF was 45 to 50% at the time with anteroseptal and anteroapical hypokinesis.  Subsequent cardiac catheterization performed on 06/12/2017 demonstrated single-vessel disease with 99% mid LAD lesion treated with 2.5 x 20 mm DES postdilated to 2.75 mm.  There was no other significant residual CAD.  He was last seen by Dr. Herbie Baltimore in October 2023, he was placed on Crestor 20 mg daily as a trial.  He was seen by our clinical pharmacist in December 2023 at which time he was tolerating the Crestor without significant side effect.  LDL was 48 at this time.  Patient presents today for follow-up.  He denies any recent chest pain.  He reports leg pain potentially related to rosuvastatin use.  Pain improved after moving rosuvastatin to nighttime.  We discussed potential possibility of switching to PCSK9 inhibitor versus inclisiran.  Patient is interested consider inclisiran.  Recent blood work showed cholesterol is very well-controlled controlled despite having leg pain.  Hemoglobin A1c has increased to 7.8 potentially related to dietary changes.  Overall, he is doing well from the cardiac perspective and can follow-up in 1 year.  ROS:   He denies chest pain, palpitations, dyspnea, pnd, orthopnea, n, v, dizziness, syncope, edema, weight gain, or early satiety. All other systems reviewed and are otherwise negative except as noted above.    Studies Reviewed: Marland Kitchen   EKG Interpretation Date/Time:  Friday September 12 2023 08:09:14 EDT Ventricular Rate:  61 PR Interval:  156 QRS Duration:  86 QT Interval:  382 QTC  Calculation: 384 R Axis:   16  Text Interpretation: Normal sinus rhythm  Poor R wave progression in the anterior leads Confirmed by Azalee Course [phone]) on 09/12/2023 8:21:25 AM    Cardiac Studies & Procedures   CARDIAC CATHETERIZATION  CARDIAC CATHETERIZATION 06/12/2017  Narrative Images from the original result were not included.   Culprit Lesion: Mid LAD lesion, 99 %stenosed.  A STENT SYNERGY DES 2.5X20 drug eluting stent was successfully placed. Post-dilated to 2.75 mm  Post intervention, there is a 0% residual stenosis.  There is mild left ventricular systolic dysfunction. The left ventricular ejection fraction is 45-50% by visual estimate. With distal anterior-anteroapical hypokinesis  LV end diastolic pressure is mildly elevated.  Severe single vessel disease with mid LAD stenosis treated with a single DES stent.  Mildly reduced EF consistent with anterior MI.  Plan:  Transfer to 6 central post procedure unit for ongoing care TR band removal.  Expected discharge tomorrow on dual therapy  Aggressive risk factor modification per primary cardiologist.   Bryan Lemma, M.D., M.S. Interventional Cardiologist  Pager # [phone] Phone # [phone] [address] [place], [place] [address]  Findings Coronary Findings Diagnostic  Dominance: Right  Left Main Vessel is large.  Left Anterior Descending Vessel is moderate in size. Culprit lesion. The lesion is type A, concentric, irregular and ulcerative.  First Septal Branch Vessel is small in size.  Second Diagonal Branch Vessel is small in size.  Second Septal Branch Vessel is small in size.  Third [employer]  Vessel is small in size.  Ramus Intermedius Vessel is moderate in size.  Left Circumflex Vessel is moderate in size.  Lateral First Obtuse Marginal Branch Vessel is small in size.  Second Obtuse Marginal Branch Vessel is small in size.  Right Coronary Artery Vessel is  large. The vessel exhibits minimal luminal irregularities.  Acute Marginal Branch Vessel is small in size.  Right Posterior Descending Artery Vessel is moderate in size.  Inferior Septal Vessel is small in size.  Right Posterior Atrioventricular Artery Vessel is moderate in size.  First Right Posterolateral Branch Vessel is small in size.  Intervention  Mid LAD lesion Angioplasty Lesion length: 14 mm. Lesion crossed with guidewire using a WIRE ASAHI PROWATER 180CM. Pre-stent angioplasty was performed using a BALLOON SAPPHIRE 2.0X12. Maximum pressure: 12 atm. Inflation time: 20 sec. A STENT SYNERGY DES 2.5X20 drug eluting stent was successfully placed. Minimum lumen area: 2.7 mm. Stent strut is well apposed. Post-stent angioplasty was performed using a STENT SYNERGY DES 2.5X20. Maximum pressure: 16 atm. Inflation time: 20 sec. Stent Balloon at high Atm. - 2.75 mm The pre-interventional distal flow is decreased (TIMI 2).  The post-interventional distal flow is normal (TIMI 3). The intervention was successful . No complications occurred at this lesion. CATH VISTA GUIDE 6FR XB3 SH. There is a 0% residual stenosis post intervention.     ECHOCARDIOGRAM  ECHOCARDIOGRAM COMPLETE 06/11/2017  Narrative *[name]* *[facility]* [address] [place], [place] [address] [phone]  ------------------------------------------------------------------- Transthoracic Echocardiography  Patient:    Brett, Collier MR #:       [id number] Study Date: 06/11/2017 Gender:     M Age:        51 Height:     167.6 cm Weight:     80.6 kg BSA:        1.96 m^2 Pt. Status: Room:       3W09C  ADMITTING    [name], M.D. ATTENDING    [name], M.D. PERFORMING   [facility], Inpatient Illene Bolus, Waqas T REFERRING    Timoteo Expose T SONOGRAPHER  Sinda Du, RDCS  cc:  ------------------------------------------------------------------- LV EF: 45% -    50%  ------------------------------------------------------------------- History:   PMH:  chest pain.  Risk factors:  Diabetes mellitus.  ------------------------------------------------------------------- Study Conclusions  - Left ventricle: The cavity size was normal. Systolic function was mildly reduced. The estimated ejection fraction was in the range of 45% to 50%. Hypokinesis of the mid-apicalanteroseptal, anterior, and apical myocardium.  ------------------------------------------------------------------- Study data:  No prior study was available for comparison.  Study status:  Routine.  Procedure:  Transthoracic echocardiography. Image quality was adequate.  Study completion:  There were no complications.          Transthoracic echocardiography.  M-mode, complete 2D, spectral Doppler, and color Doppler.  Birthdate: Patient birthdate: 1966-08-11.  Age:  Patient is 57 yr old.  Sex: Gender: male.    BMI: 28.7 kg/m^2.  Blood pressure:     121/75 Patient status:  Inpatient.  Study date:  Study date: 06/11/2017. Study time: 03:19 PM.  Location:  Bedside.  -------------------------------------------------------------------  ------------------------------------------------------------------- Left ventricle:  The cavity size was normal. Systolic function was mildly reduced. The estimated ejection fraction was in the range of 45% to 50%.  Regional wall motion abnormalities:   Hypokinesis of the mid-apicalanteroseptal, anterior, and apical myocardium.  ------------------------------------------------------------------- Aortic valve:   Trileaflet; normal thickness leaflets. Mobility was not restricted.  Doppler:  Transvalvular velocity was  within the normal range. There was no stenosis. There was no regurgitation.  ------------------------------------------------------------------- Aorta:  Aortic root: The aortic root was normal in  size.  ------------------------------------------------------------------- Mitral valve:   Structurally normal valve.   Mobility was not restricted.  Doppler:  Transvalvular velocity was within the normal range. There was no evidence for stenosis. There was no regurgitation.    Valve area by pressure half-time: 3.06 cm^2. Indexed valve area by pressure half-time: 1.56 cm^2/m^2.  ------------------------------------------------------------------- Left atrium:  The atrium was normal in size.  ------------------------------------------------------------------- Right ventricle:  The cavity size was normal. Wall thickness was normal. Systolic function was normal.  ------------------------------------------------------------------- Pulmonic valve:   Not well visualized.  The valve appears to be grossly normal.    Doppler:  Transvalvular velocity was within the normal range. There was no evidence for stenosis.  ------------------------------------------------------------------- Tricuspid valve:   Structurally normal valve.    Doppler: Transvalvular velocity was within the normal range. There was no regurgitation.  ------------------------------------------------------------------- Right atrium:  The atrium was normal in size.  ------------------------------------------------------------------- Pericardium:  There was no pericardial effusion.  ------------------------------------------------------------------- Systemic veins: Inferior vena cava: The vessel was normal in size.  ------------------------------------------------------------------- Measurements  Left ventricle                          Value          Reference LV ID, ED, PLAX chordal                 43.2  mm       43 - 52 LV ID, ES, PLAX chordal                 28.2  mm       23 - 38 LV fx shortening, PLAX chordal          35    %        >=29 LV PW thickness, ED                     11.5  mm       ---------- IVS/LV PW  ratio, ED                     0.97           <=1.3 Stroke volume, 2D                       57    ml       ---------- Stroke volume/bsa, 2D                   29    ml/m^2   ---------- LV e&', lateral                          4.79  cm/s     ---------- LV E/e&', lateral                        12.44          ---------- LV e&', medial                           5.87  cm/s     ---------- LV E/e&', medial  10.15          ---------- LV e&', average                          5.33  cm/s     ---------- LV E/e&', average                        11.18          ----------  Ventricular septum                      Value          Reference IVS thickness, ED                       11.1  mm       ----------  LVOT                                    Value          Reference LVOT ID, S                              22    mm       ---------- LVOT area                               3.8   cm^2     ---------- LVOT peak velocity, S                   97.9  cm/s     ---------- LVOT mean velocity, S                   60    cm/s     ---------- LVOT VTI, S                             14.9  cm       ---------- LVOT peak gradient, S                   4     mm Hg    ----------  Aorta                                   Value          Reference Aortic root ID, ED                      28    mm       ----------  Left atrium                             Value          Reference LA ID, A-P, ES                          31    mm       ---------- LA ID/bsa, A-P  1.58  cm/m^2   <=2.2 LA volume, S                            27.7  ml       ---------- LA volume/bsa, S                        14.1  ml/m^2   ---------- LA volume, ES, 1-p A4C                  21.9  ml       ---------- LA volume/bsa, ES, 1-p A4C              11.2  ml/m^2   ---------- LA volume, ES, 1-p A2C                  33.8  ml       ---------- LA volume/bsa, ES, 1-p A2C              17.3  ml/m^2   ----------  Mitral valve                             Value          Reference Mitral E-wave peak velocity             59.6  cm/s     ---------- Mitral A-wave peak velocity             65.1  cm/s     ---------- Mitral deceleration time        (H)     246   ms       150 - 230 Mitral pressure half-time               72    ms       ---------- Mitral E/A ratio, peak                  0.9            ---------- Mitral valve area, PHT, DP              3.06  cm^2     ---------- Mitral valve area/bsa, PHT, DP          1.56  cm^2/m^2 ----------  Right atrium                            Value          Reference RA ID, S-I, ES, A4C                     45    mm       34 - 49 RA area, ES, A4C                        11.9  cm^2     8.3 - 19.5 RA volume, ES, A/L                      24    ml       ---------- RA volume/bsa, ES, A/L                  12.3  ml/m^2   ----------  Systemic veins  Value          Reference Estimated CVP                           3     mm Hg    ----------  Right ventricle                         Value          Reference RV ID, minor axis, ED, A4C base         34.5  mm       ---------- TAPSE                                   16.1  mm       ---------- RV s&', lateral, S                       13.2  cm/s     ----------  Legend: (L)  and  (H)  mark values outside specified reference range.  ------------------------------------------------------------------- Prepared and Electronically Authenticated by  Georga Hacking, MD [facility] 2018-07-04T16:08:48             Risk Assessment/Calculations:            Physical Exam:   VS:  BP 120/62   Pulse 61   Ht 5\' 7"  (1.702 m)   Wt 166 lb 3.2 oz (75.4 kg)   SpO2 97%   BMI 26.03 kg/m    Wt Readings from Last 3 Encounters:  09/12/23 166 lb 3.2 oz (75.4 kg)  09/11/22 170 lb 9.6 oz (77.4 kg)  08/02/21 165 lb (74.8 kg)    GEN: Well nourished, well developed in no acute distress NECK: No JVD; No carotid bruits CARDIAC: RRR, no murmurs,  rubs, gallops RESPIRATORY:  Clear to auscultation without rales, wheezing or rhonchi  ABDOMEN: Soft, non-tender, non-distended EXTREMITIES:  No edema; No deformity   ASSESSMENT AND PLAN: .    Coronary artery disease No recent chest pain reported.  -Continue metoprolol at current dose. -Encourage increased physical activity to raise HDL levels.  Hypertension Blood pressure very well-controlled.  Continue metoprolol  Hyperlipidemia Patient reports leg pain, potentially related to rosuvastatin use. Pain has improved since switching medication administration to nighttime. LDL is well controlled at 38. Discussed potential switch to injectable cholesterol medication, inclisiran, due to persistent leg pain. -Refer to clinical pharmacist for consultation regarding potential switch to inclisiran.  Diabetes Mellitus Recent increase in HbA1c to 7.8, potentially related to dietary changes. Patient reports increased consumption of bread, Gatorade, and sugary cereal. No changes in medication regimen at this time. -Encourage dietary modifications to reduce sugar intake.         Dispo: Follow-up in 1 year.  Referred to lipid clinic to consider inclisiran  Signed, Azalee Course, PA

## 2023-09-12 NOTE — Patient Instructions (Signed)
Medication Instructions:  NO CHANGES *If you need a refill on your cardiac medications before your next appointment, please call your pharmacy*   Lab Work: NO LABS If you have labs (blood work) drawn today and your tests are completely normal, you will receive your results only by: MyChart Message (if you have MyChart) OR A paper copy in the mail If you have any lab test that is abnormal or we need to change your treatment, we will call you to review the results.   Testing/Procedures: NO TESTING   Follow-Up: At [facility], you and your health needs are our priority.  As part of our continuing mission to provide you with exceptional heart care, we have created designated Provider Care Teams.  These Care Teams include your primary Cardiologist (physician) and Advanced Practice Providers (APPs -  Physician Assistants and Nurse Practitioners) who all work together to provide you with the care you need, when you need it.   Your next appointment:   1 year(s)  Provider:   Bryan Lemma, MD   Other Instructions NEEDS APPOINTMENT WITH LIPID CLINIC WITHIN THE NEXT FEW WEEKS FOR Parke Simmers

## 2023-09-14 ENCOUNTER — Other Ambulatory Visit: Payer: Self-pay | Admitting: Cardiology

## 2023-10-02 ENCOUNTER — Other Ambulatory Visit: Payer: Self-pay | Admitting: Cardiology

## 2023-12-01 ENCOUNTER — Other Ambulatory Visit: Payer: Self-pay | Admitting: Cardiology

## 2023-12-15 ENCOUNTER — Ambulatory Visit: Payer: BC Managed Care – PPO | Attending: Cardiology

## 2024-03-03 DIAGNOSIS — I2510 Atherosclerotic heart disease of native coronary artery without angina pectoris: Secondary | ICD-10-CM | POA: Diagnosis not present

## 2024-03-03 DIAGNOSIS — M5432 Sciatica, left side: Secondary | ICD-10-CM | POA: Diagnosis not present

## 2024-03-03 DIAGNOSIS — Z125 Encounter for screening for malignant neoplasm of prostate: Secondary | ICD-10-CM | POA: Diagnosis not present

## 2024-03-03 DIAGNOSIS — E119 Type 2 diabetes mellitus without complications: Secondary | ICD-10-CM | POA: Diagnosis not present

## 2024-09-02 ENCOUNTER — Other Ambulatory Visit: Payer: Self-pay | Admitting: Cardiology

## 2024-09-02 DIAGNOSIS — Z1331 Encounter for screening for depression: Secondary | ICD-10-CM | POA: Diagnosis not present

## 2024-09-02 DIAGNOSIS — I2510 Atherosclerotic heart disease of native coronary artery without angina pectoris: Secondary | ICD-10-CM | POA: Diagnosis not present

## 2024-09-02 DIAGNOSIS — Z0000 Encounter for general adult medical examination without abnormal findings: Secondary | ICD-10-CM | POA: Diagnosis not present

## 2024-09-02 DIAGNOSIS — E119 Type 2 diabetes mellitus without complications: Secondary | ICD-10-CM | POA: Diagnosis not present

## 2024-09-02 DIAGNOSIS — M5432 Sciatica, left side: Secondary | ICD-10-CM | POA: Diagnosis not present

## 2024-09-13 ENCOUNTER — Encounter: Payer: Self-pay | Admitting: Cardiology

## 2024-09-13 ENCOUNTER — Telehealth: Payer: Self-pay | Admitting: Cardiology

## 2024-09-13 ENCOUNTER — Ambulatory Visit: Attending: Cardiology | Admitting: Cardiology

## 2024-09-13 VITALS — BP 136/71 | HR 67 | Ht 68.0 in | Wt 167.0 lb

## 2024-09-13 DIAGNOSIS — G720 Drug-induced myopathy: Secondary | ICD-10-CM

## 2024-09-13 DIAGNOSIS — E785 Hyperlipidemia, unspecified: Secondary | ICD-10-CM | POA: Diagnosis not present

## 2024-09-13 DIAGNOSIS — I2510 Atherosclerotic heart disease of native coronary artery without angina pectoris: Secondary | ICD-10-CM

## 2024-09-13 DIAGNOSIS — Z9861 Coronary angioplasty status: Secondary | ICD-10-CM

## 2024-09-13 DIAGNOSIS — I255 Ischemic cardiomyopathy: Secondary | ICD-10-CM

## 2024-09-13 DIAGNOSIS — I214 Non-ST elevation (NSTEMI) myocardial infarction: Secondary | ICD-10-CM

## 2024-09-13 DIAGNOSIS — M79606 Pain in leg, unspecified: Secondary | ICD-10-CM

## 2024-09-13 DIAGNOSIS — T466X5D Adverse effect of antihyperlipidemic and antiarteriosclerotic drugs, subsequent encounter: Secondary | ICD-10-CM

## 2024-09-13 DIAGNOSIS — I10 Essential (primary) hypertension: Secondary | ICD-10-CM | POA: Diagnosis not present

## 2024-09-13 DIAGNOSIS — I998 Other disorder of circulatory system: Secondary | ICD-10-CM

## 2024-09-13 MED ORDER — METOPROLOL SUCCINATE ER 25 MG PO TB24: 25.0000 mg | ORAL_TABLET | Freq: Every day | ORAL | 3 refills | Status: AC

## 2024-09-13 MED ORDER — METOPROLOL SUCCINATE ER 50 MG PO TB24
50.0000 mg | ORAL_TABLET | Freq: Every day | ORAL | 3 refills | Status: DC
Start: 2024-09-13 — End: 2024-09-13

## 2024-09-13 MED ORDER — ROSUVASTATIN CALCIUM 20 MG PO TABS: 20.0000 mg | ORAL_TABLET | Freq: Every day | ORAL | 3 refills | Status: AC

## 2024-09-13 MED ORDER — CO Q 10 100 MG PO CAPS: 300.0000 mg | ORAL_CAPSULE | Freq: Every day | ORAL | Status: AC

## 2024-09-13 NOTE — Assessment & Plan Note (Signed)
 Mildly reduced EF of 45 to 50%.  I suspect that it is improved.   He remains on low-dose beta-blocker which we will convert to Toprol  25 mg daily. If pressures do increase, would consider ARB such as losartan, irbesartan or olmesartan.

## 2024-09-13 NOTE — Assessment & Plan Note (Signed)
 Managed with metoprolol  tartrate. Current regimen is one whole tablet once daily. - Switch to metoprolol  succinate once current prescription is finished. - Low threshold to add ARB

## 2024-09-13 NOTE — Assessment & Plan Note (Signed)
 Status post LAD PCI in the setting of STEMI 7 years ago.  No further anginal symptoms. -With LAD stent, he is on maintenance Plavix  monotherapy send 5 mg daily  -> Okay to hold Plavix  5 to 7 days preop for surgeries or procedures.

## 2024-09-13 NOTE — Assessment & Plan Note (Addendum)
 Single-vessel CAD with only mild diffuse RCA disease with severe LAD disease treated with PCI the setting of STEMI. No active anginal symptoms.  Only major issue is statin myopathy. - Continue low-dose beta-blocker but will convert from Lopressor  to Toprol  since he is not taking it as prescribed (only taking 12.5 mg daily Lopressor )  - Convert to Toprol  XL 25 mg daily - Continue Farxiga 10 mg daily - Continue SAPT maintenance with Plavix  75 mg daily - He would like to actually go back to the 20 mg of rosuvastatin  that had his lipids well-controlled, and not try different medications.  - Go back to 20 mg rosuvastatin  daily but start every other day and then titrate up after 1 week  - Recommend co-Q10 100 mg tabs that increases to 3 times daily.

## 2024-09-13 NOTE — Patient Instructions (Addendum)
 Medication Instructions:   Stop Metoprolol  tarrate   Start taking metoprolol  succinate 25 mg daily    Restart taking Rosuvastatin    take every other day for a 1 week then increase to 1 daily  Start taking C Q 10 100 mg and increase to 300 mg  ( you can them all at one tine or three times a day    *If you need a refill on your cardiac medications before your next appointment, please call your pharmacy*   Lab Work:2 - 3 months   CK  lipids  If you have labs (blood work) drawn today and your tests are completely normal, you will receive your results only by: MyChart Message (if you have MyChart) OR A paper copy in the mail If you have any lab test that is abnormal or we need to change your treatment, we will call you to review the results.   Testing/Procedures:  Not neeeded  Follow-Up: At [facility], you and your health needs are our priority.  As part of our continuing mission to provide you with exceptional heart care, we have created designated Provider Care Teams.  These Care Teams include your primary Cardiologist (physician) and Advanced Practice Providers (APPs -  Physician Assistants and Nurse Practitioners) who all work together to provide you with the care you need, when you need it.     Your next appointment:   2 month(s)  The format for your next appointment:   In Person  Provider:   Alm Clay, MD   Other Instructions

## 2024-09-13 NOTE — Assessment & Plan Note (Signed)
 Did not tolerate atorvastatin  with memory issue and myopathy. Unfortunately now he is having myopathy symptoms with Crestor .  Unfortunately, his lipids did show the expected bump in LDL with the lower dose of statin. He actually said he tolerated the 20 mg better.   He would prefer to avoid different medications and would like to go back to 20 mg to see how he does. I recommended that he use co-Q10 titrated up to 300 mg daily.  I also talked about the potential of PCSK9 inhibitors, but initially suggested that we go to 10 mg rosuvastatin  every other day plus Zetia 10 mg and he was not interested in another medication.   Plan to reassess his lipids in about 2 months and see if we potentially need to consider PCSK9 inhibitors.

## 2024-09-13 NOTE — Assessment & Plan Note (Signed)
 Hyperlipidemia with statin-associated myalgia LDL increased to 76 mg/dL after reducing rosuvastatin  dose. Myalgia affects knees and right leg, especially at night. - Initiate CoQ10 100 mg three times daily with meals. - Adjust rosuvastatin  to 20 mg every other day for one week, then increase to 20 mg daily. - Consider adding Zetia if cholesterol levels do not improve. - Recheck lipid levels in 2-3 months. - Schedule follow-up with clinical pharmacist if necessary.

## 2024-09-13 NOTE — Progress Notes (Signed)
 Cardiology Office Note:  .   Date:  09/13/2024  ID:  Brett Collier, DOB 1966-02-05, MRN [id number] PCP: Brett Lot, PA-C  [facility] Providers Cardiologist:  Alm Clay, MD     Chief Complaint  Patient presents with   Follow-up    Annual follow-up; still having issues with leg pain/cramping from statin myopathy.  Currently taking 10 mg daily rosuvastatin ; diabetes better controlled.   Coronary Artery Disease    Doing well with no active angina symptoms.    Patient Profile: .     Brett Collier is a  58 y.o. male  with a PMH notable for CAD, HTN, HLD and DM-2 who presents here for Annual follow-up at the request of Brett Collier.  PMH  CAD-non-STEMI / ICM (June 11, 2017): --> EF 45-50% with anteroseptal and anteroapical hypokinesis. He had a LAD lesion treated with DES stent.  He was noted to be diabetic in his hospitalization.=  DM-2  HTN, HLD   I last saw her right knee in October 2023 and he was doing well.  No major complaints.  Exercising just not routinely.  No cardiac symptoms.     Brett Collier was last seen on September 12, 2023 by Brett Ford PA -> seems to be doing relatively well having changed rosuvastatin  dosing to p.m.  He voiced interest in trying PCSK9 inhibitor.  A1c was 7.8, and LDL was 38.  Due to leg pain from statin referred to pharmacist team to discuss PCSK9 meter.  Subjective  Discussed the use of AI scribe software for clinical note transcription with the patient, who gave verbal consent to proceed.  History of Present Illness Brett Collier is a 58 year old male with hyperlipidemia and diabetes who presents for management of leg cramps and medication adjustment.  He experiences increased leg cramps, particularly at night, which wake him up. The cramps occur in his knees and muscles on the right side. He finds relief by doing a 'bicycle thing' to pop his knees. The cramping worsened when he resumed taking his cholesterol medication,  rosuvastatin , at a lower dose of 10 mg daily after a three-week break.  He has a history of hyperlipidemia and was previously on rosuvastatin  20 mg daily, which he stopped for three weeks. Upon resuming, he initially took it every other day before switching to 10 mg daily. He noticed a difference in symptoms when he stopped the medication, with less cramping. His LDL cholesterol levels increased from the 40s to 76 after reducing the medication dose and becoming less active.  He is currently taking metoprolol  tartrate as a whole tablet once a day instead of the prescribed half tablet twice a day. He is also on metformin 1000 mg twice a day and Farxiga 10 mg daily for diabetes. Additionally, he takes Plavix .  He works second shift, which has affected his routine and physical activity, leading to decreased walking and exercise. He used to walk regularly and work 12-hour shifts, which he believes contributed to better cholesterol levels in the past. No chest pain, pressure, shortness of breath, heart palpitations, dizziness, or neurological symptoms.  Cardiovascular ROS: no chest pain or dyspnea on exertion positive for - leg cramps & aching - myopathy negative for - edema, irregular heartbeat, orthopnea, palpitations, paroxysmal nocturnal dyspnea, rapid heart rate, shortness of breath, or syncope/near syncope, TIA/Amaurosis fugax, claudication Not as active - NOt walking like he used to   ROS:  Review of Systems - Negative except Sx noted above ->  most notable symptom is chronic leg aching and cramping.  Worse at night, not exacerbated with exercise.    Objective   Current Medications: - Crestor  10 mg daily - Metoprolol  tartrate half a tablet twice a day -> only taking once daily. - Farxiga 10 mg daily; - Metformin 1000 mg twice daily; - Aspirin  81 mg daily  Studies Reviewed: SABRA   EKG Interpretation Date/Time:  Monday September 13 2024 08:26:10 EDT Ventricular Rate:  66 PR Interval:  152 QRS  Duration:  88 QT Interval:  388 QTC Calculation: 406 R Axis:   21  Text Interpretation: Normal sinus rhythm Septal infarct (cited on or before 13-Sep-2024) When compared with ECG of 12-Sep-2023 08:09, No significant change was found Confirmed by Anner Lenis ([id number]) on 09/13/2024 8:55:34 AM    Labs: 09/02/2024: TC 105, TG 199, HDL 10??;  LDL 76; A1c 6.7; Cr 0.97, K+ 4.3; ALT 26; TSH 0.56; Hgb 16.2, PLT 189   ECHO 06/11/2017: EF 45-50%. Hypokinesis of mid-anteroseptal & anterior/antero-apical wall.  Cardiac Cath-PCI June 12, 2017 -  Severe single vessel disease  - mid LAD 99% --> DES PCI DES stent (SYNERGY DES 2.5X20 --> 2.75 mm). Mildly reduced EF ~45%, WM consistent with anterior MI. Wall Motion                        Diagnostic Diagram                                       Post-Intervention Diagram        Risk Assessment/Calculations:              Physical Exam:   VS:  BP 136/71 (BP Location: Left Arm, Patient Position: Sitting, Cuff Size: Normal)   Pulse 67   Ht 5' 8 (1.727 m)   Wt 167 lb (75.8 kg)   SpO2 96%   BMI 25.39 kg/m    Wt Readings from Last 3 Encounters:  09/13/24 167 lb (75.8 kg)  09/12/23 166 lb 3.2 oz (75.4 kg)  09/11/22 170 lb 9.6 oz (77.4 kg)      GEN: Healthy-appearing.  Well nourished, well developed in no acute distress; NECK: No JVD; No carotid bruits CARDIAC: Normal S1, S2; RRR, no murmurs, rubs, gallops RESPIRATORY:  Clear to auscultation without rales, wheezing or rhonchi ; nonlabored, good air movement. ABDOMEN: Soft, non-tender, non-distended EXTREMITIES:  No edema; No deformity      ASSESSMENT AND PLAN: .    Problem List Items Addressed This Visit       Cardiology Problems   CAD S/P percutaneous coronary angioplasty (Chronic)   Status post LAD PCI in the setting of STEMI 7 years ago.  No further anginal symptoms. -With LAD stent, he is on maintenance Plavix  monotherapy send 5 mg daily  -> Okay to hold Plavix  5 to 7 days preop for  surgeries or procedures.      Relevant Medications   rosuvastatin  (CRESTOR ) 20 MG tablet   metoprolol  succinate (TOPROL  XL) 25 MG 24 hr tablet   Other Relevant Orders   EKG 12-Lead (Completed)   CK   Lipid panel   Coronary artery disease involving native coronary artery of native heart without angina pectoris - Primary (Chronic)   Single-vessel CAD with only mild diffuse RCA disease with severe LAD disease treated with PCI the setting of STEMI. No active anginal symptoms.  Only major issue is statin myopathy. - Continue low-dose beta-blocker but will convert from Lopressor  to Toprol  since he is not taking it as prescribed (only taking 12.5 mg daily Lopressor )  - Convert to Toprol  XL 25 mg daily - Continue Farxiga 10 mg daily - Continue SAPT maintenance with Plavix  75 mg daily - He would like to actually go back to the 20 mg of rosuvastatin  that had his lipids well-controlled, and not try different medications.  - Go back to 20 mg rosuvastatin  daily but start every other day and then titrate up after 1 week  - Recommend co-Q10 100 mg tabs that increases to 3 times daily.      Relevant Medications   rosuvastatin  (CRESTOR ) 20 MG tablet   metoprolol  succinate (TOPROL  XL) 25 MG 24 hr tablet   Other Relevant Orders   EKG 12-Lead (Completed)   CK   Lipid panel   Essential hypertension (Chronic)   Managed with metoprolol  tartrate. Current regimen is one whole tablet once daily. - Switch to metoprolol  succinate once current prescription is finished. - Low threshold to add ARB      Relevant Medications   rosuvastatin  (CRESTOR ) 20 MG tablet   metoprolol  succinate (TOPROL  XL) 25 MG 24 hr tablet   Other Relevant Orders   EKG 12-Lead (Completed)   CK   Lipid panel   Hyperlipidemia with target low density lipoprotein (LDL) cholesterol less than 55 mg/dL (Chronic)   Hyperlipidemia with statin-associated myalgia LDL increased to 76 mg/dL after reducing rosuvastatin  dose. Myalgia affects  knees and right leg, especially at night. - Initiate CoQ10 100 mg three times daily with meals. - Adjust rosuvastatin  to 20 mg every other day for one week, then increase to 20 mg daily. - Consider adding Zetia if cholesterol levels do not improve. - Recheck lipid levels in 2-3 months. - Schedule follow-up with clinical pharmacist if necessary.      Relevant Medications   rosuvastatin  (CRESTOR ) 20 MG tablet   metoprolol  succinate (TOPROL  XL) 25 MG 24 hr tablet   Other Relevant Orders   EKG 12-Lead (Completed)   CK   Lipid panel   Ischemic cardiomyopathy (Chronic)   Mildly reduced EF of 45 to 50%.  I suspect that it is improved.   He remains on low-dose beta-blocker which we will convert to Toprol  25 mg daily. If pressures do increase, would consider ARB such as losartan, irbesartan or olmesartan.      Relevant Medications   rosuvastatin  (CRESTOR ) 20 MG tablet   metoprolol  succinate (TOPROL  XL) 25 MG 24 hr tablet   Other Relevant Orders   EKG 12-Lead (Completed)   CK   Lipid panel   NSTEMI (non-ST elevated myocardial infarction) (HCC) (Chronic)   7 years out from anterior STEMI.  Mildly reduced EF at that time, we did not reassess, but I suppose that he probably is improved. No recurrent angina or heart failure symptoms. No further testing unless symptoms dictate      Relevant Medications   rosuvastatin  (CRESTOR ) 20 MG tablet   metoprolol  succinate (TOPROL  XL) 25 MG 24 hr tablet   Other Relevant Orders   EKG 12-Lead (Completed)   CK   Lipid panel     Other   Ischemic leg pain (Chronic)   Relevant Orders   EKG 12-Lead (Completed)   CK   Lipid panel   Statin myopathy (Chronic)   Did not tolerate atorvastatin  with memory issue and myopathy. Unfortunately now he is having myopathy symptoms  with Crestor .  Unfortunately, his lipids did show the expected bump in LDL with the lower dose of statin. He actually said he tolerated the 20 mg better.   He would prefer to avoid  different medications and would like to go back to 20 mg to see how he does. I recommended that he use co-Q10 titrated up to 300 mg daily.  I also talked about the potential of PCSK9 inhibitors, but initially suggested that we go to 10 mg rosuvastatin  every other day plus Zetia 10 mg and he was not interested in another medication.   Plan to reassess his lipids in about 2 months and see if we potentially need to consider PCSK9 inhibitors.              Follow-Up: Return in about 9 weeks (around 11/15/2024) for Followup with Telemedicine, 3-4 month follow-up, [employer].  I spent 41 minutes in the care of Brett Collier today including reviewing outside labs from PCP via [facility] (1 minute), reviewing studies (reviewed most recent echo report and cath films-4 minutes), face to face time discussing treatment options (25 minutes), reviewing records from previous clinic note (2 minutes), 9 minutes dictating, and documenting in the encounter.      Signed, Alm MICAEL Clay, MD, MS Alm Clay, M.D., M.S. Interventional Cardiologist  [facility] Pager # [phone]

## 2024-09-13 NOTE — Telephone Encounter (Signed)
 Spoke to pharmacist   Clarification of new medication- patient is to ake Metoprolol  succinate 25 mg.  Medication changes  and  Metoprolol  50 mg discontinue.  New prescription e-sent to pharmacy.   Pharmacist verbalized understanding.

## 2024-09-13 NOTE — Assessment & Plan Note (Signed)
 7 years out from anterior STEMI.  Mildly reduced EF at that time, we did not reassess, but I suppose that he probably is improved. No recurrent angina or heart failure symptoms. No further testing unless symptoms dictate

## 2024-09-13 NOTE — Telephone Encounter (Signed)
 Pt c/o medication issue:  1. Name of Medication:  metoprolol  succinate (TOPROL -XL) 50 MG 24 hr tablet   2. How are you currently taking this medication (dosage and times per day)?    3. Are you having a reaction (difficulty breathing--STAT)? no  4. What is your medication issue? Calling to verify the dosage of patient medication. Please advise

## 2024-09-28 ENCOUNTER — Other Ambulatory Visit: Payer: Self-pay | Admitting: Cardiology

## 2024-10-12 ENCOUNTER — Telehealth: Payer: Self-pay | Admitting: Cardiology

## 2024-10-12 NOTE — Telephone Encounter (Signed)
 Left message to call back.

## 2024-10-12 NOTE — Telephone Encounter (Signed)
 Pt c/o medication issue:  1. Name of Medication: rosuvastatin  (CRESTOR ) 20 MG tablet   2. How are you currently taking this medication (dosage and times per day)?  Take 1 tablet (20 mg total) by mouth daily.   3. Are you having a reaction (difficulty breathing--STAT)? No  4. What is your medication issue? Pt is requesting a callback regarding this medication having a recall on it. Please advise.

## 2024-10-15 NOTE — Telephone Encounter (Signed)
 Spoke with pt, he reports his rosuvastatin  is not involved in recall.

## 2024-11-15 ENCOUNTER — Ambulatory Visit: Admitting: Cardiology
